# Patient Record
Sex: Female | Born: 1960 | Race: Black or African American | Hispanic: No | Marital: Single | Smoking: Former smoker
Health system: Southern US, Community
[De-identification: ages and names within clinical notes are randomized; demographics above are authoritative.]

## PROBLEM LIST (undated history)

## (undated) DIAGNOSIS — I10 Essential (primary) hypertension: Secondary | ICD-10-CM

## (undated) DIAGNOSIS — N12 Tubulo-interstitial nephritis, not specified as acute or chronic: Secondary | ICD-10-CM

## (undated) DIAGNOSIS — M542 Cervicalgia: Secondary | ICD-10-CM

## (undated) DIAGNOSIS — E119 Type 2 diabetes mellitus without complications: Secondary | ICD-10-CM

## (undated) HISTORY — DX: Tubulo-interstitial nephritis, not specified as acute or chronic: N12

## (undated) HISTORY — DX: Cervicalgia: M54.2

## (undated) HISTORY — DX: Essential (primary) hypertension: I10

## (undated) HISTORY — DX: Type 2 diabetes mellitus without complications: E11.9

---

## 1998-03-15 ENCOUNTER — Encounter: Admission: RE | Admit: 1998-03-15 | Discharge: 1998-03-15 | Payer: Self-pay | Admitting: Family Medicine

## 1998-12-01 ENCOUNTER — Encounter: Admission: RE | Admit: 1998-12-01 | Discharge: 1998-12-01 | Payer: Self-pay | Admitting: Family Medicine

## 1998-12-11 ENCOUNTER — Encounter: Admission: RE | Admit: 1998-12-11 | Discharge: 1998-12-11 | Payer: Self-pay | Admitting: Family Medicine

## 1999-01-09 ENCOUNTER — Encounter: Admission: RE | Admit: 1999-01-09 | Discharge: 1999-01-09 | Payer: Self-pay | Admitting: Family Medicine

## 1999-05-15 ENCOUNTER — Encounter: Admission: RE | Admit: 1999-05-15 | Discharge: 1999-05-15 | Payer: Self-pay | Admitting: Family Medicine

## 1999-06-05 ENCOUNTER — Encounter: Admission: RE | Admit: 1999-06-05 | Discharge: 1999-06-05 | Payer: Self-pay | Admitting: Sports Medicine

## 1999-10-18 ENCOUNTER — Encounter: Admission: RE | Admit: 1999-10-18 | Discharge: 1999-10-18 | Payer: Self-pay | Admitting: Family Medicine

## 1999-12-04 ENCOUNTER — Encounter: Admission: RE | Admit: 1999-12-04 | Discharge: 1999-12-04 | Payer: Self-pay | Admitting: Family Medicine

## 2000-09-09 ENCOUNTER — Encounter: Payer: Self-pay | Admitting: Emergency Medicine

## 2000-09-09 ENCOUNTER — Emergency Department (HOSPITAL_COMMUNITY): Admission: EM | Admit: 2000-09-09 | Discharge: 2000-09-09 | Payer: Self-pay | Admitting: Emergency Medicine

## 2000-11-19 ENCOUNTER — Encounter: Admission: RE | Admit: 2000-11-19 | Discharge: 2000-11-19 | Payer: Self-pay | Admitting: Family Medicine

## 2001-04-01 ENCOUNTER — Encounter: Admission: RE | Admit: 2001-04-01 | Discharge: 2001-04-01 | Payer: Self-pay | Admitting: Family Medicine

## 2001-07-18 ENCOUNTER — Encounter (INDEPENDENT_AMBULATORY_CARE_PROVIDER_SITE_OTHER): Payer: Self-pay | Admitting: *Deleted

## 2001-09-16 HISTORY — PX: ABDOMINAL HYSTERECTOMY: SUR658

## 2002-07-08 ENCOUNTER — Encounter: Admission: RE | Admit: 2002-07-08 | Discharge: 2002-07-08 | Payer: Self-pay | Admitting: Family Medicine

## 2002-11-05 ENCOUNTER — Encounter: Admission: RE | Admit: 2002-11-05 | Discharge: 2002-11-05 | Payer: Self-pay | Admitting: Family Medicine

## 2002-11-10 ENCOUNTER — Encounter: Admission: RE | Admit: 2002-11-10 | Discharge: 2002-11-10 | Payer: Self-pay | Admitting: Family Medicine

## 2002-11-10 ENCOUNTER — Encounter: Payer: Self-pay | Admitting: Family Medicine

## 2002-12-07 ENCOUNTER — Encounter: Admission: RE | Admit: 2002-12-07 | Discharge: 2002-12-07 | Payer: Self-pay | Admitting: Family Medicine

## 2003-07-21 ENCOUNTER — Inpatient Hospital Stay (HOSPITAL_COMMUNITY): Admission: RE | Admit: 2003-07-21 | Discharge: 2003-07-24 | Payer: Self-pay | Admitting: Obstetrics

## 2003-07-21 ENCOUNTER — Encounter (INDEPENDENT_AMBULATORY_CARE_PROVIDER_SITE_OTHER): Payer: Self-pay | Admitting: Specialist

## 2004-10-22 ENCOUNTER — Ambulatory Visit: Payer: Self-pay | Admitting: Family Medicine

## 2006-10-07 ENCOUNTER — Ambulatory Visit: Payer: Self-pay | Admitting: Family Medicine

## 2006-11-13 DIAGNOSIS — Z87891 Personal history of nicotine dependence: Secondary | ICD-10-CM | POA: Insufficient documentation

## 2006-11-13 DIAGNOSIS — D869 Sarcoidosis, unspecified: Secondary | ICD-10-CM | POA: Insufficient documentation

## 2006-11-14 ENCOUNTER — Encounter (INDEPENDENT_AMBULATORY_CARE_PROVIDER_SITE_OTHER): Payer: Self-pay | Admitting: *Deleted

## 2007-02-05 ENCOUNTER — Ambulatory Visit: Payer: Self-pay | Admitting: Family Medicine

## 2007-02-05 DIAGNOSIS — E0865 Diabetes mellitus due to underlying condition with hyperglycemia: Secondary | ICD-10-CM

## 2007-02-05 DIAGNOSIS — E1165 Type 2 diabetes mellitus with hyperglycemia: Secondary | ICD-10-CM | POA: Insufficient documentation

## 2007-02-05 DIAGNOSIS — E7800 Pure hypercholesterolemia, unspecified: Secondary | ICD-10-CM | POA: Insufficient documentation

## 2007-02-05 DIAGNOSIS — E089 Diabetes mellitus due to underlying condition without complications: Secondary | ICD-10-CM | POA: Insufficient documentation

## 2007-02-05 DIAGNOSIS — E1169 Type 2 diabetes mellitus with other specified complication: Secondary | ICD-10-CM | POA: Insufficient documentation

## 2007-02-05 LAB — CONVERTED CEMR LAB
ALT: 10 units/L (ref 0–35)
AST: 11 units/L (ref 0–37)
Albumin: 4.8 g/dL (ref 3.5–5.2)
BUN: 13 mg/dL (ref 6–23)
Calcium: 9.8 mg/dL (ref 8.4–10.5)
Chloride: 99 meq/L (ref 96–112)
Glucose, Bld: 382 mg/dL — ABNORMAL HIGH (ref 70–99)
LDL Cholesterol: 78 mg/dL (ref 0–99)
Potassium: 4.8 meq/L (ref 3.5–5.3)
Sodium: 138 meq/L (ref 135–145)
Total Bilirubin: 0.5 mg/dL (ref 0.3–1.2)
Total Protein: 7.9 g/dL (ref 6.0–8.3)
VLDL: 26 mg/dL (ref 0–40)

## 2007-02-19 ENCOUNTER — Ambulatory Visit: Payer: Self-pay | Admitting: Family Medicine

## 2007-03-16 ENCOUNTER — Encounter: Admission: RE | Admit: 2007-03-16 | Discharge: 2007-03-30 | Payer: Self-pay | Admitting: Family Medicine

## 2007-03-18 ENCOUNTER — Telehealth: Payer: Self-pay | Admitting: *Deleted

## 2007-04-30 ENCOUNTER — Encounter: Admission: RE | Admit: 2007-04-30 | Discharge: 2007-04-30 | Payer: Self-pay | Admitting: Family Medicine

## 2007-04-30 ENCOUNTER — Ambulatory Visit: Payer: Self-pay | Admitting: Family Medicine

## 2007-04-30 IMAGING — CR DG WRIST COMPLETE 3+V*R*
4 series · 4 of 4 positions shown · non-contrast
Comparison: none

CLINICAL DATA: Pain and swelling.
 RIGHT WRIST ? FOUR VIEWS:
 The radiocarpal joint space appears normal and the carpal bones are in normal position.  No bony abnormality is seen.  No soft tissue calcification is noted.

[view not recorded (1 of 4)]
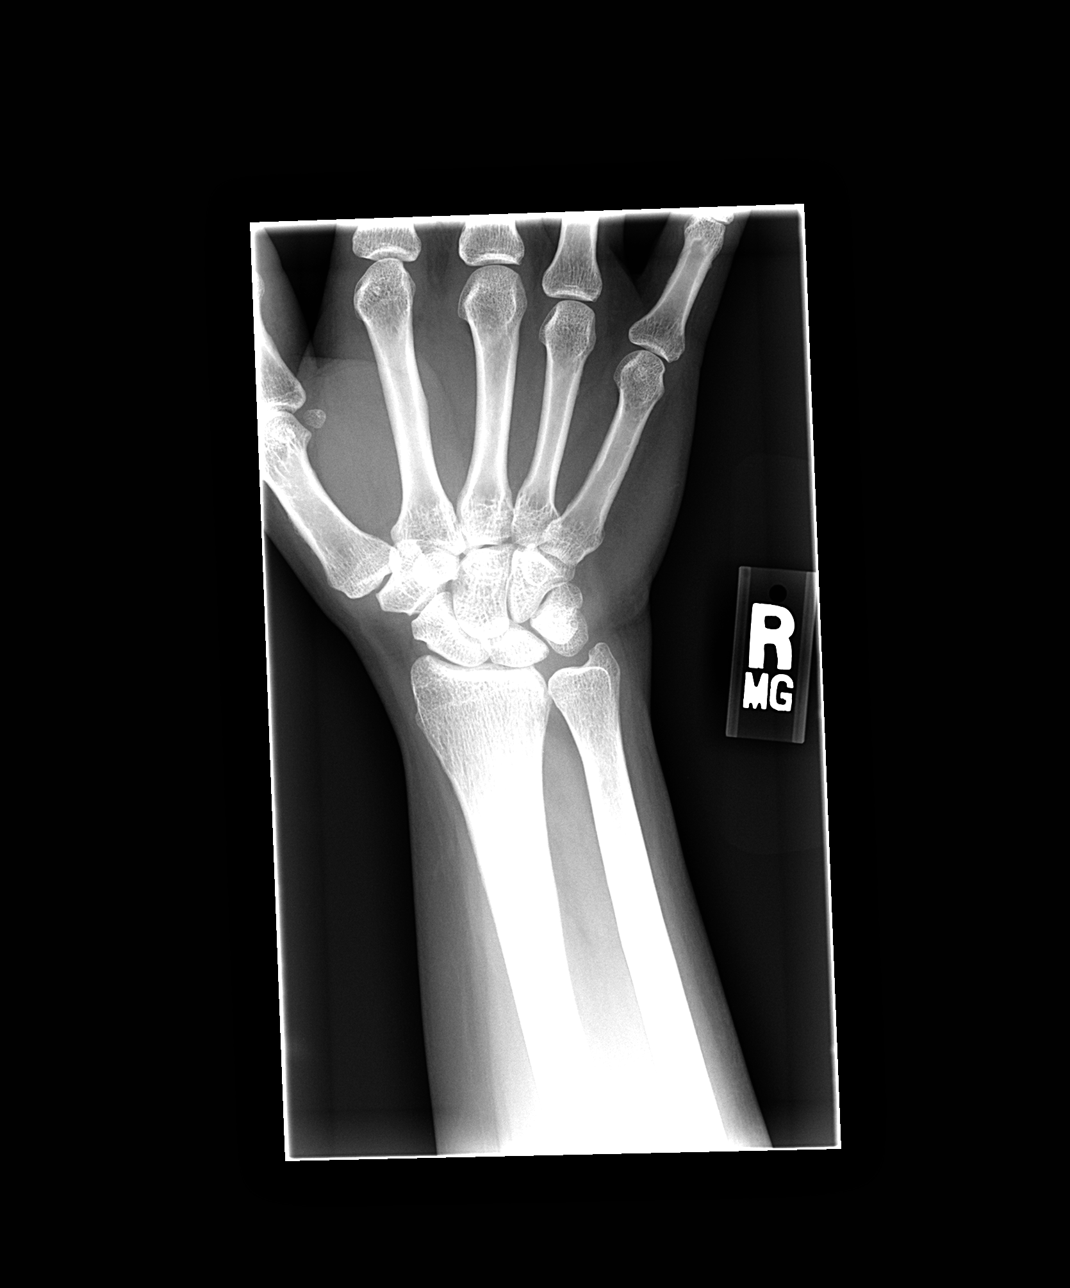

[view not recorded (2 of 4)]
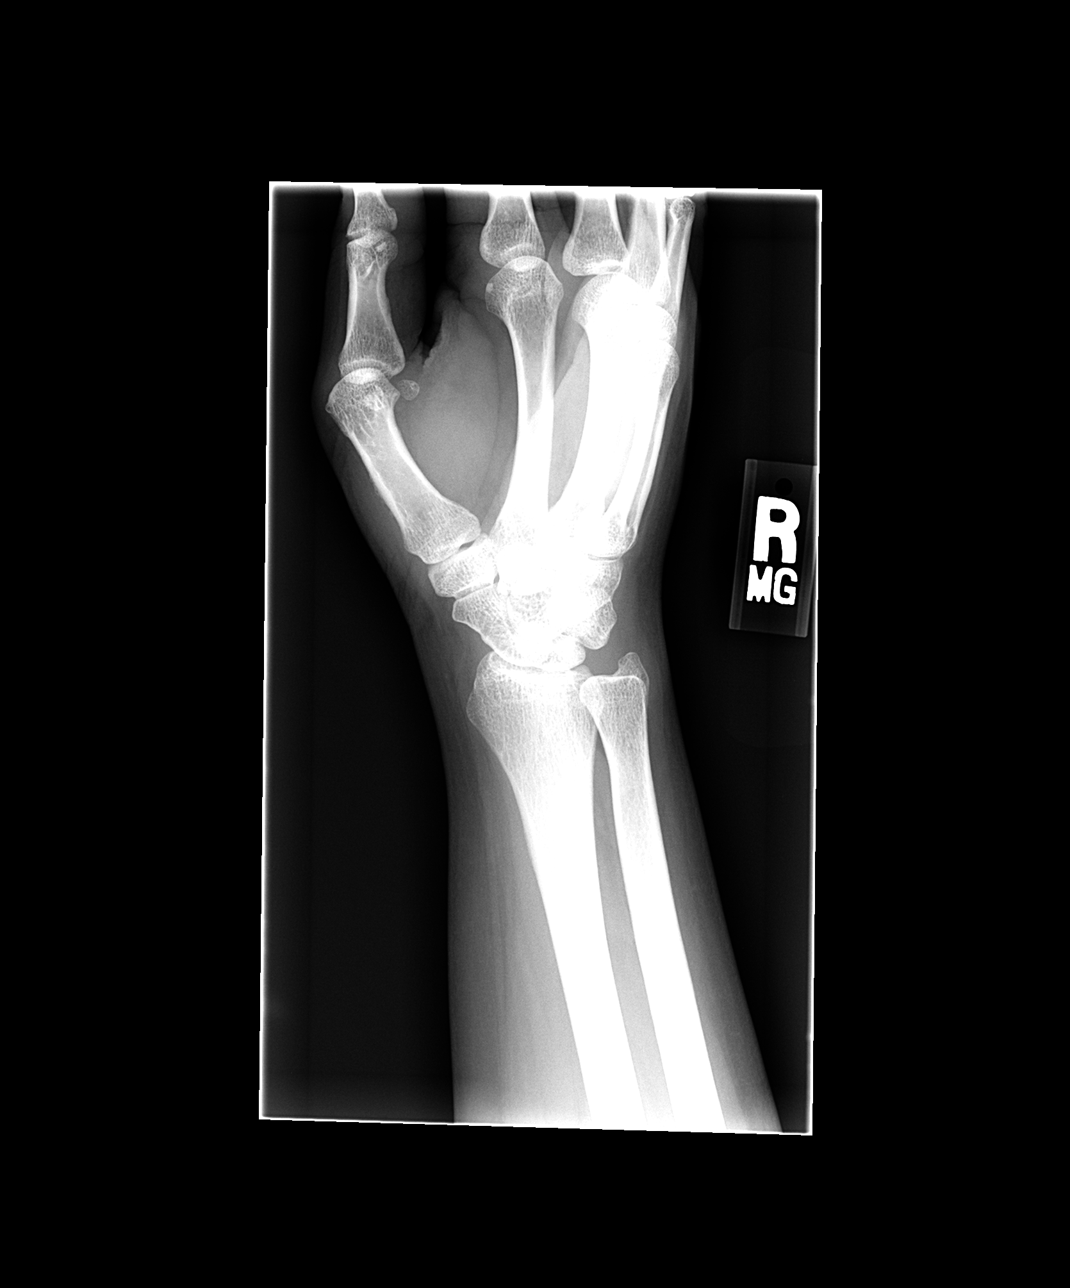

[view not recorded (3 of 4)]
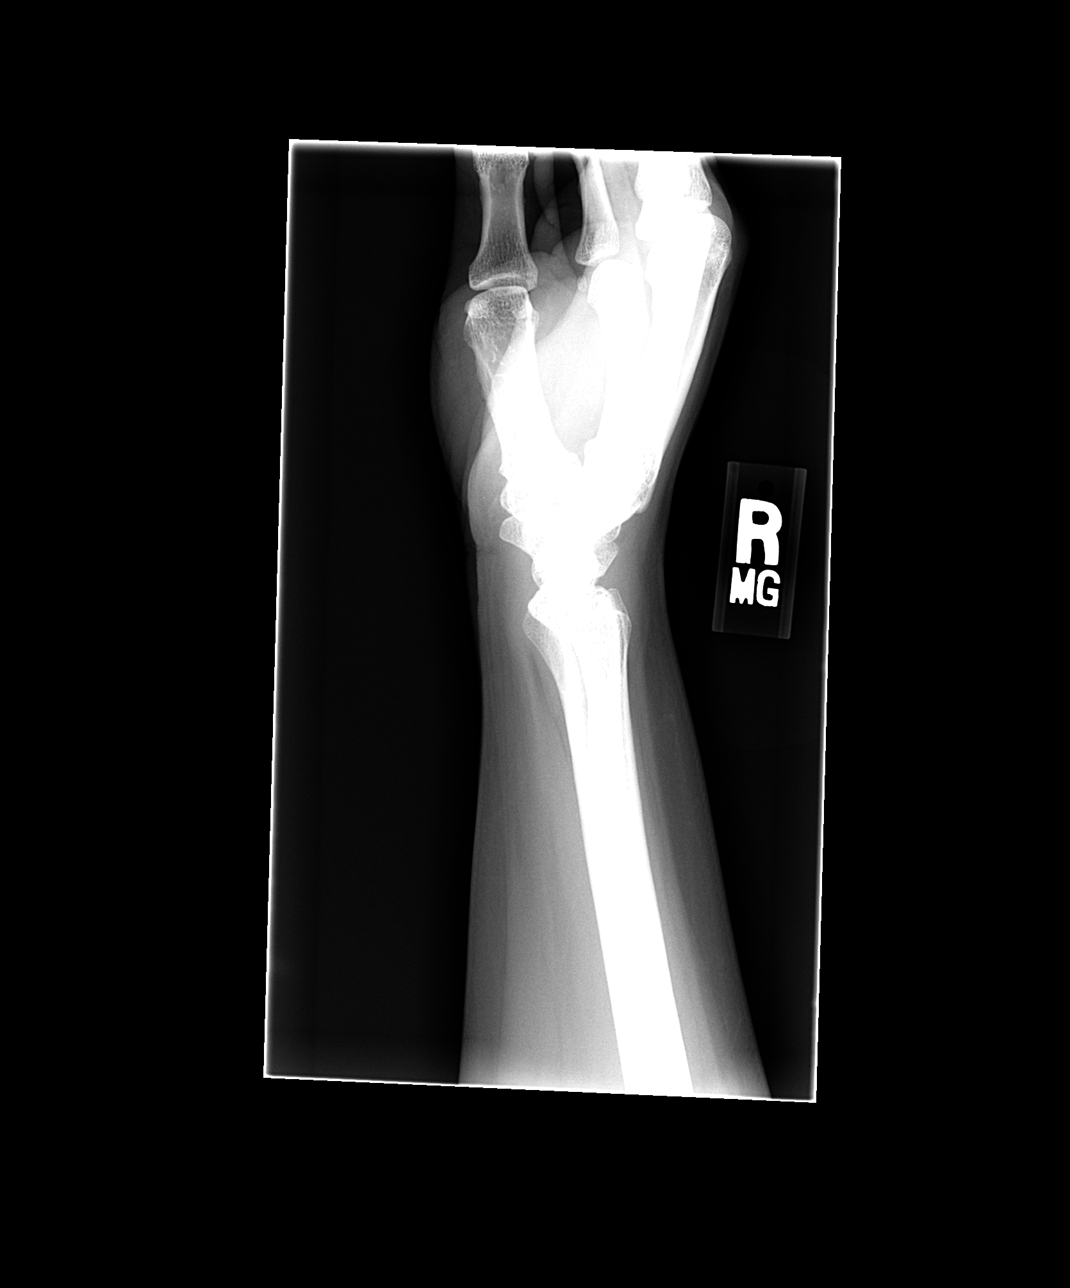

[view not recorded (4 of 4)]
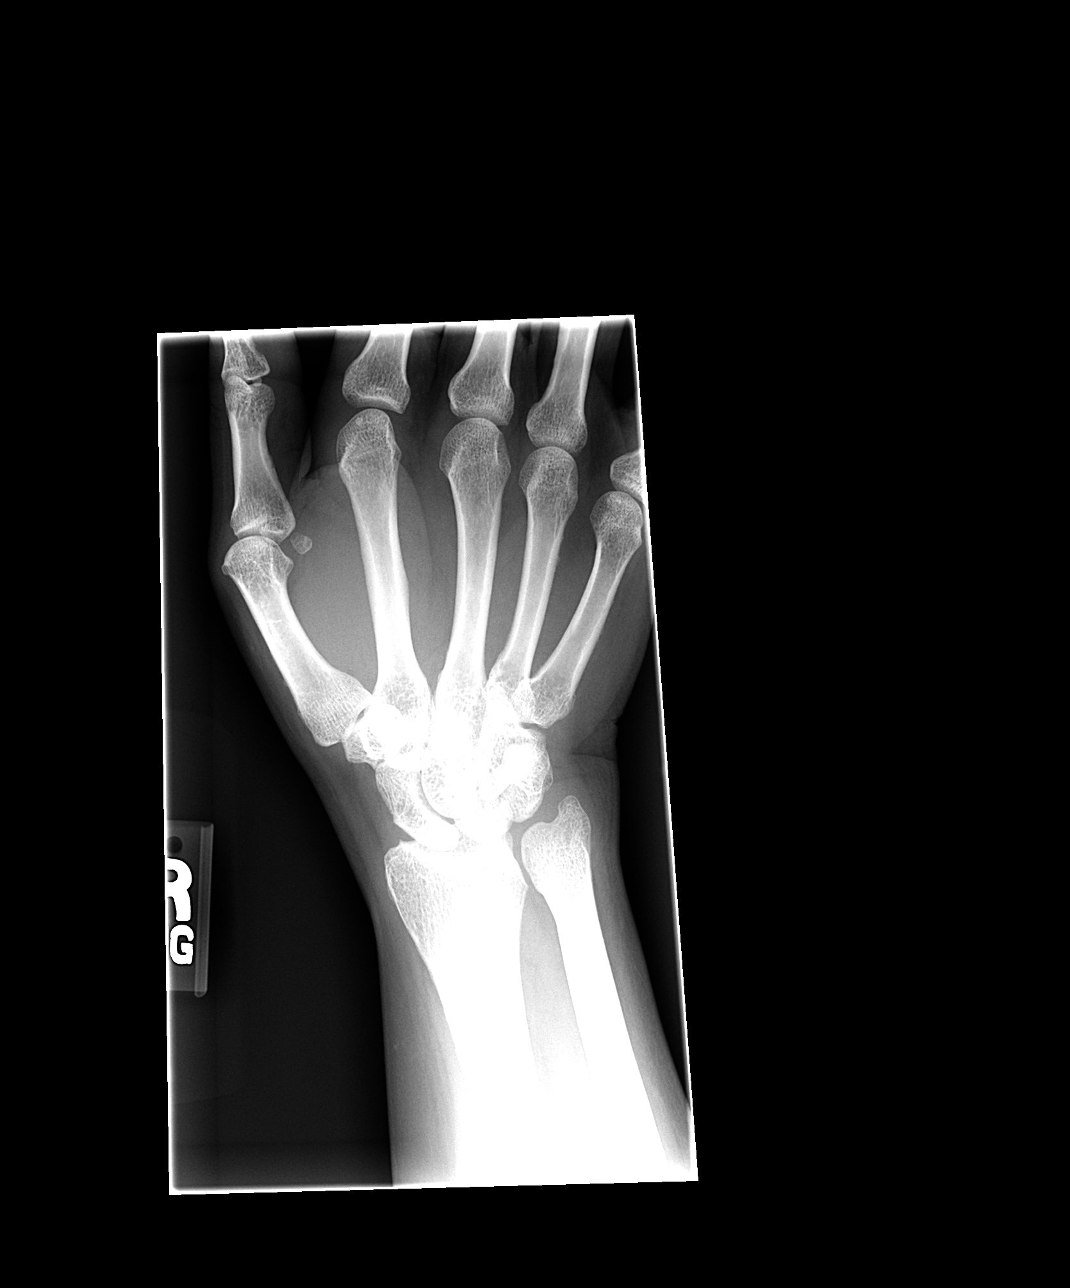

[4 of 4 positions shown; findings below may reference images not displayed]

IMPRESSION: Negative right wrist.

## 2007-05-04 ENCOUNTER — Encounter: Payer: Self-pay | Admitting: Family Medicine

## 2007-08-25 ENCOUNTER — Telehealth: Payer: Self-pay | Admitting: Family Medicine

## 2007-09-29 ENCOUNTER — Encounter: Payer: Self-pay | Admitting: *Deleted

## 2008-02-22 ENCOUNTER — Encounter: Payer: Self-pay | Admitting: Family Medicine

## 2008-02-22 ENCOUNTER — Ambulatory Visit: Payer: Self-pay | Admitting: Family Medicine

## 2008-02-22 DIAGNOSIS — I10 Essential (primary) hypertension: Secondary | ICD-10-CM | POA: Insufficient documentation

## 2008-02-22 LAB — CONVERTED CEMR LAB
Hgb A1c MFr Bld: 7 %
Hgb A1c MFr Bld: 7 %

## 2008-03-10 ENCOUNTER — Telehealth: Payer: Self-pay | Admitting: *Deleted

## 2008-03-10 ENCOUNTER — Ambulatory Visit: Payer: Self-pay | Admitting: Family Medicine

## 2008-03-10 LAB — CONVERTED CEMR LAB
LDL Cholesterol: 65 mg/dL (ref 0–99)
VLDL: 14 mg/dL (ref 0–40)

## 2008-03-11 ENCOUNTER — Encounter: Payer: Self-pay | Admitting: Family Medicine

## 2008-04-14 ENCOUNTER — Telehealth: Payer: Self-pay | Admitting: *Deleted

## 2008-05-09 ENCOUNTER — Telehealth: Payer: Self-pay | Admitting: *Deleted

## 2008-05-30 ENCOUNTER — Encounter: Payer: Self-pay | Admitting: Family Medicine

## 2008-06-02 ENCOUNTER — Ambulatory Visit: Payer: Self-pay | Admitting: Family Medicine

## 2008-06-02 LAB — CONVERTED CEMR LAB
CO2: 26 meq/L (ref 19–32)
Calcium: 9.4 mg/dL (ref 8.4–10.5)
Creatinine, Ser: 0.86 mg/dL (ref 0.40–1.20)
Hgb A1c MFr Bld: 7.8 %
Sodium: 140 meq/L (ref 135–145)

## 2008-06-06 ENCOUNTER — Encounter: Payer: Self-pay | Admitting: Family Medicine

## 2008-06-17 ENCOUNTER — Telehealth: Payer: Self-pay | Admitting: Family Medicine

## 2009-07-18 ENCOUNTER — Encounter: Payer: Self-pay | Admitting: Family Medicine

## 2009-08-09 ENCOUNTER — Ambulatory Visit: Payer: Self-pay | Admitting: Family Medicine

## 2009-08-09 LAB — CONVERTED CEMR LAB
BUN: 12 mg/dL (ref 6–23)
Cholesterol: 147 mg/dL (ref 0–200)
HDL: 49 mg/dL (ref >39)
Hgb A1c MFr Bld: 9.9 %
Potassium: 4.5 meq/L (ref 3.5–5.3)
Sodium: 138 meq/L (ref 135–145)
Triglycerides: 104 mg/dL (ref <150)
VLDL: 21 mg/dL (ref 0–40)

## 2009-09-21 ENCOUNTER — Telehealth: Payer: Self-pay | Admitting: Family Medicine

## 2009-10-06 ENCOUNTER — Telehealth: Payer: Self-pay | Admitting: Family Medicine

## 2009-11-23 ENCOUNTER — Encounter: Payer: Self-pay | Admitting: Family Medicine

## 2009-12-18 ENCOUNTER — Ambulatory Visit: Payer: Self-pay | Admitting: Family Medicine

## 2010-02-02 ENCOUNTER — Ambulatory Visit: Payer: Self-pay | Admitting: Family Medicine

## 2010-02-05 ENCOUNTER — Ambulatory Visit: Payer: Self-pay | Admitting: Family Medicine

## 2010-03-16 DIAGNOSIS — N12 Tubulo-interstitial nephritis, not specified as acute or chronic: Secondary | ICD-10-CM

## 2010-03-16 HISTORY — DX: Tubulo-interstitial nephritis, not specified as acute or chronic: N12

## 2010-04-10 ENCOUNTER — Ambulatory Visit: Payer: Self-pay | Admitting: Family Medicine

## 2010-04-10 ENCOUNTER — Encounter: Payer: Self-pay | Admitting: Family Medicine

## 2010-04-10 DIAGNOSIS — N10 Acute tubulo-interstitial nephritis: Secondary | ICD-10-CM | POA: Insufficient documentation

## 2010-04-10 LAB — CONVERTED CEMR LAB
Bilirubin Urine: NEGATIVE
Nitrite: POSITIVE
Protein, U semiquant: 30
Urobilinogen, UA: 0.2

## 2010-04-12 ENCOUNTER — Telehealth: Payer: Self-pay | Admitting: Family Medicine

## 2010-04-13 ENCOUNTER — Encounter: Payer: Self-pay | Admitting: Family Medicine

## 2010-04-14 ENCOUNTER — Inpatient Hospital Stay (HOSPITAL_COMMUNITY): Admission: EM | Admit: 2010-04-14 | Discharge: 2010-04-15 | Payer: Self-pay | Admitting: Family Medicine

## 2010-04-14 ENCOUNTER — Encounter: Payer: Self-pay | Admitting: Family Medicine

## 2010-04-14 ENCOUNTER — Encounter: Payer: Self-pay | Admitting: Emergency Medicine

## 2010-04-14 ENCOUNTER — Ambulatory Visit: Payer: Self-pay | Admitting: Family Medicine

## 2010-04-14 IMAGING — CR DG CHEST 2V
2 series · 2 of 2 positions shown · non-contrast
Comparison: None

CLINICAL DATA: Fever and flank pain.

CHEST - 2 VIEW

[w chest pa]
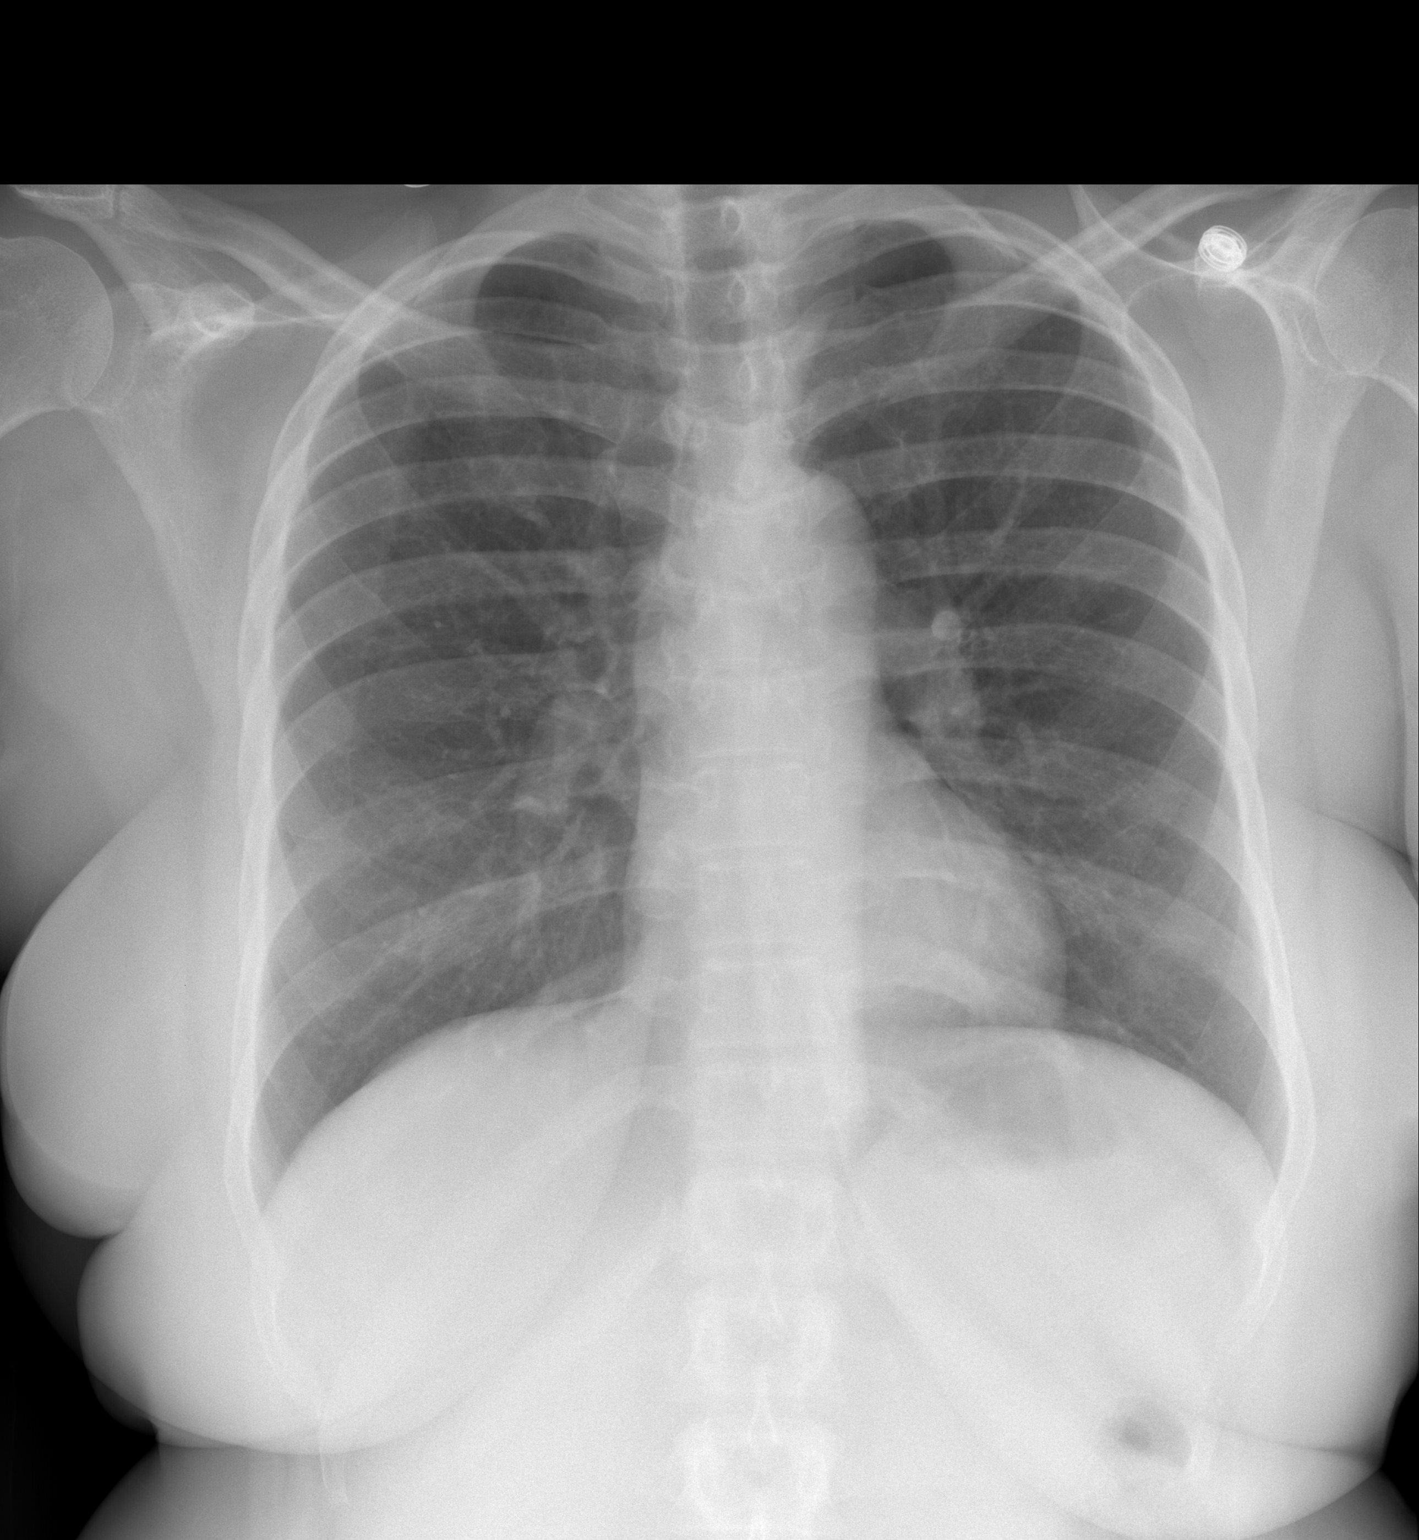

[w chest lat]
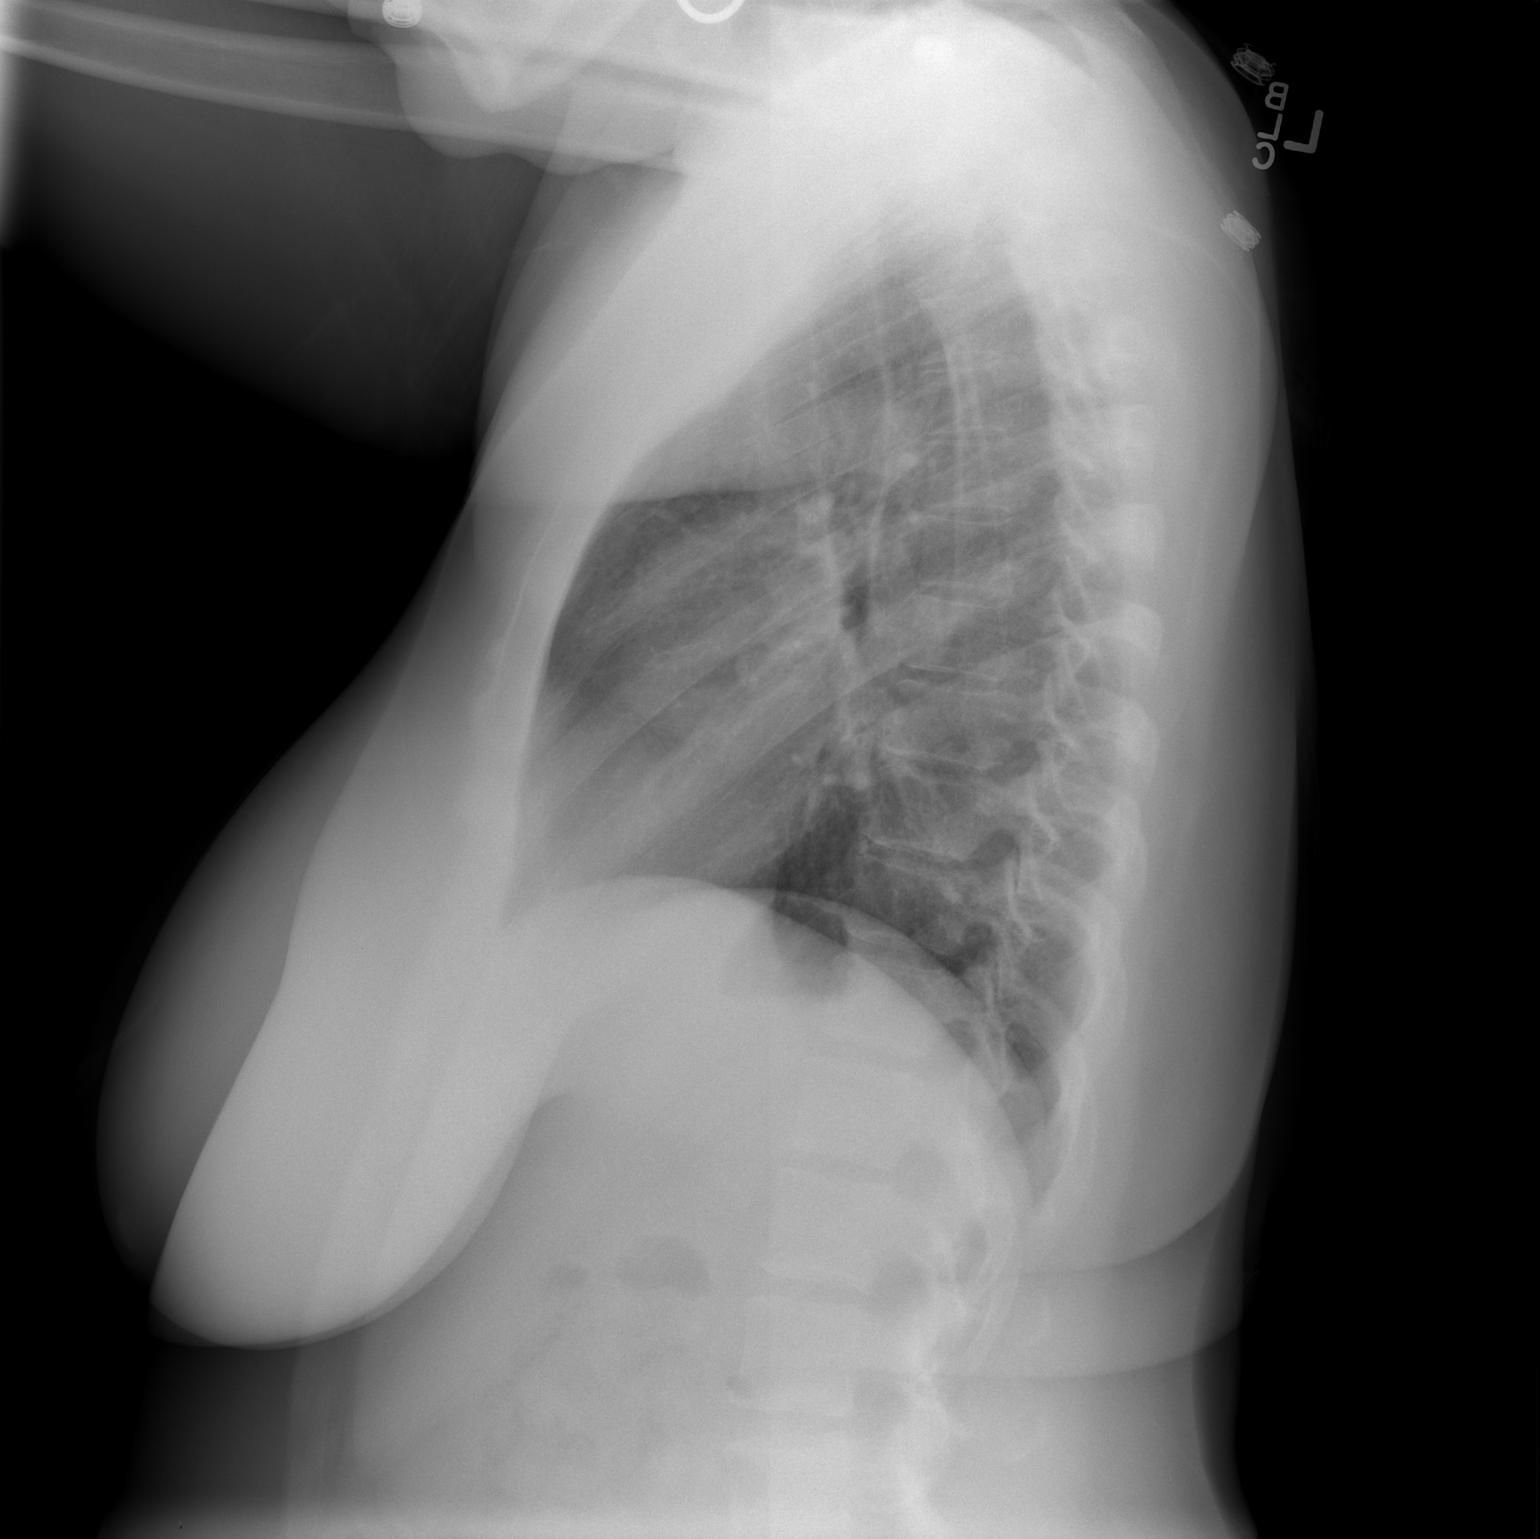

[2 of 2 positions shown; findings below may reference images not displayed]

FINDINGS: The cardiac silhouette, mediastinal and hilar contours
are within normal limits.  The lungs are clear.  The bony thorax is
intact.
IMPRESSION: No acute cardiopulmonary findings.

## 2010-04-30 ENCOUNTER — Ambulatory Visit: Payer: Self-pay | Admitting: Family Medicine

## 2010-04-30 LAB — CONVERTED CEMR LAB: Hgb A1c MFr Bld: 12 %

## 2010-06-08 ENCOUNTER — Telehealth: Payer: Self-pay | Admitting: Family Medicine

## 2010-10-05 ENCOUNTER — Ambulatory Visit: Admission: RE | Admit: 2010-10-05 | Discharge: 2010-10-05 | Payer: Self-pay | Source: Home / Self Care

## 2010-10-05 DIAGNOSIS — B029 Zoster without complications: Secondary | ICD-10-CM | POA: Insufficient documentation

## 2010-10-18 NOTE — Assessment & Plan Note (Signed)
Summary: F/U VISIT/[facility]   Vital Signs:  Patient profile:   50 year old female Height:      65 inches Weight:      193.1 pounds BMI:     32.25 Temp:     98.2 degrees F oral Pulse rate:   73 / minute BP sitting:   112 / 76  (left arm) Cuff size:   regular  Vitals Entered By: Garen Grams LPN (Feb 05, 2010 9:20 AM) CC: f/u blood sugar Is Patient Diabetic? Yes Pain Assessment Patient in pain? no        CC:  f/u blood sugar.  History of Present Illness: DIABETES Disease Monitoring Blood Sugar ranges:most > 200    Polyuria:N   Visual problems:N Medications Compliance:knows all her meds and doses and taking faithflly   Hypoglycemic symptoms:N Prevention Trying to watch carbs but weight has not decreased any.      Habits & Providers  Alcohol-Tobacco-Diet     Tobacco Status: quit  Current Medications (verified): 1)  Metformin Hcl 1000 Mg Tabs (Metformin Hcl) .... Take 1 Tablet in Am  11/2 in Pm 2)  Hydrochlorothiazide 25 Mg  Tabs (Hydrochlorothiazide) .... Take 1 Tab By Mouth Every Morning 3)  Hydroquinone 4 % Crea (Hydroquinone) .... Apply Two Times A Day To Lighten Skin Stop When Starts To Fade 1 Tube 4)  Accu-Chek Aviva  Strp (Glucose Blood) .... Once Daily 5)  Glipizide 10 Mg Tabs (Glipizide) .Marland Kitchen.. 1 Daily 6)  Amoxicillin 875 Mg Tabs (Amoxicillin) .... Two Times A Day For 7 Days 7)  Flonase 50 Mcg/act Susp (Fluticasone Propionate) .... Two Squirts in Each Nostril Daily  Allergies: 1)  Codeine Phosphate (Codeine Phosphate)   Impression & Recommendations:  Problem # 1:  DIABETES-TYPE 2 (ICD-250.00) Assessment Deteriorated  blood sugar are not well controlled.  She may be interested in [place] but would like to see if can lose weight first.  diabetes was controlled in 2009 when weighed 10 lbs less. Continue current medications  Her updated medication list for this problem includes:    Metformin Hcl 1000 Mg Tabs (Metformin hcl) .Marland Kitchen... Take 1 tablet in am  11/2 in  pm    Glipizide 10 Mg Tabs (Glipizide) .Marland Kitchen... 1 daily  Orders: [facility]- Est Level  3 ([address])  Problem # 2:  SINUSITIS, ACUTE (ICD-461.9) improving Her updated medication list for this problem includes:    Amoxicillin 875 Mg Tabs (Amoxicillin) .Marland Kitchen..Marland Kitchen Two times a day for 7 days    Flonase 50 Mcg/act Susp (Fluticasone propionate) .Marland Kitchen..Marland Kitchen Two squirts in each nostril daily  Complete Medication List: 1)  Metformin Hcl 1000 Mg Tabs (Metformin hcl) .... Take 1 tablet in am  11/2 in pm 2)  Hydrochlorothiazide 25 Mg Tabs (Hydrochlorothiazide) .... Take 1 tab by mouth every morning 3)  Hydroquinone 4 % Crea (Hydroquinone) .... Apply two times a day to lighten skin stop when starts to fade 1 tube 4)  Accu-chek Aviva Strp (Glucose blood) .... Once daily 5)  Glipizide 10 Mg Tabs (Glipizide) .Marland Kitchen.. 1 daily 6)  Amoxicillin 875 Mg Tabs (Amoxicillin) .... Two times a day for 7 days 7)  Flonase 50 Mcg/act Susp (Fluticasone propionate) .... Two squirts in each nostril daily  Patient Instructions: 1)  Come back in July for A1c 2)  Work on diet - consider diet history to review with your nutritionist 3)  Consider calorie counting 4)  Your aim is to lose 2 lb every week 5)  If your blood sugars  are always > 200 afer one month then call me   Prevention & Chronic Care Immunizations   Influenza vaccine: Not documented    Tetanus booster: Not documented    Pneumococcal vaccine: Not documented  Other Screening   Pap smear: Had jysterectomy in mid 2000s for benign disease  (02/23/2008)   Pap smear action/deferral: Not indicated S/P hysterectomy  (12/18/2009)   Pap smear due: 02/22/2009    Mammogram: Normal  (07/27/2009)   Mammogram due: 07/2010   Smoking status: quit  (02/05/2010)  Diabetes Mellitus   HgbA1C: 9.7  (12/18/2009)   Hemoglobin A1C due: 05/24/2008    Eye exam: Dr Dione Booze  (11/28/2009)    Foot exam: yes  (12/18/2009)   High risk foot: Not documented   Foot care education: Not documented    Foot exam due: 02/21/2009    Urine microalbumin/creatinine ratio: Not documented    Diabetes flowsheet reviewed?: Yes   Progress toward A1C goal: Unchanged  Lipids   Total Cholesterol: 147  (08/09/2009)   LDL: 77  (08/09/2009)   LDL Direct: Not documented   HDL: 49  (08/09/2009)   Triglycerides: 104  (08/09/2009)  Hypertension   Last Blood Pressure: 112 / 76  (02/05/2010)   Serum creatinine: 0.78  (08/09/2009)   Serum potassium 4.5  (08/09/2009)    Hypertension flowsheet reviewed?: Yes   Progress toward BP goal: At goal  Self-Management Support :   Personal Goals (by the next clinic visit) :     Personal A1C goal: 8  (12/18/2009)     Personal blood pressure goal: 140/90  (08/09/2009)     Personal LDL goal: 70  (08/09/2009)    Diabetes self-management support: Copy of home glucose meter record, CBG self-monitoring log, Written self-care plan  (12/18/2009)    Hypertension self-management support: BP self-monitoring log  (12/18/2009)    Hypertension self-management support not done because: Good outcomes  (08/09/2009)

## 2010-10-18 NOTE — Assessment & Plan Note (Signed)
Summary: ? sinus infection,tcb   Vital Signs:  Patient profile:   50 year old female Temp:     98.3 degrees F oral Pulse rate:   73 / minute BP sitting:   117 / 79  (left arm) Cuff size:   large  Vitals Entered By: Renato Battles slade,cma CC: sinus pressure, some congestion and head ache x 3 days. also green productive cough x 2 days. Is Patient Diabetic? Yes Pain Assessment Patient in pain? yes     Location: head Intensity: 5 Onset of pain  x 4 days.   CC:  sinus pressure and some congestion and head ache x 3 days. also green productive cough x 2 days.Marland Kitchen  History of Present Illness: Feels certain that she has a sinus infection, sick for over one week and getting sicker.  HA and a cough.  Denies fever.  Poorly controlled DM, plans to make apt with primary MD to discuss this.  Says that she watches her diet and gave examples of what she eats, did not sound too healthy.  Habits & Providers  Alcohol-Tobacco-Diet     Tobacco Status: quit  Current Medications (verified): 1)  Metformin Hcl 1000 Mg Tabs (Metformin Hcl) .... Take 1 Tablet in Am  11/2 in Pm 2)  Hydrochlorothiazide 25 Mg  Tabs (Hydrochlorothiazide) .... Take 1 Tab By Mouth Every Morning 3)  Hydroquinone 4 % Crea (Hydroquinone) .... Apply Two Times A Day To Lighten Skin Stop When Starts To Fade 1 Tube 4)  Accu-Chek Aviva  Strp (Glucose Blood) .... Once Daily 5)  Glipizide 10 Mg Tabs (Glipizide) .Marland Kitchen.. 1 Daily 6)  Amoxicillin 875 Mg Tabs (Amoxicillin) .... Two Times A Day For 7 Days 7)  Flonase 50 Mcg/act Susp (Fluticasone Propionate) .... Two Squirts in Each Nostril Daily  Allergies (verified): 1)  Codeine Phosphate (Codeine Phosphate)  Social History: Smoking Status:  quit  Review of Systems General:  Denies chills and fever. ENT:  Complains of earache, nasal congestion, postnasal drainage, sinus pressure, and sore throat. Resp:  Complains of cough and sputum productive; denies shortness of breath and  wheezing.  Physical Exam  General:  In no acute distress, had a sheet wrapped around her. Ears:  TM reddened and retracted Nose:  inflammed red turbs Mouth:  Post nasal drainage present Lungs:  normal respiratory effort and normal breath sounds.   Heart:  normal rate and regular rhythm.     Impression & Recommendations:  Problem # 1:  SINUSITIS, ACUTE (ICD-461.9)  Her updated medication list for this problem includes:    Amoxicillin 875 Mg Tabs (Amoxicillin) .Marland Kitchen..Marland Kitchen Two times a day for 7 days    Flonase 50 Mcg/act Susp (Fluticasone propionate) .Marland Kitchen..Marland Kitchen Two squirts in each nostril daily  Orders: [facility]- Est Level  3 (04540)  Complete Medication List: 1)  Metformin Hcl 1000 Mg Tabs (Metformin hcl) .... Take 1 tablet in am  11/2 in pm 2)  Hydrochlorothiazide 25 Mg Tabs (Hydrochlorothiazide) .... Take 1 tab by mouth every morning 3)  Hydroquinone 4 % Crea (Hydroquinone) .... Apply two times a day to lighten skin stop when starts to fade 1 tube 4)  Accu-chek Aviva Strp (Glucose blood) .... Once daily 5)  Glipizide 10 Mg Tabs (Glipizide) .Marland Kitchen.. 1 daily 6)  Amoxicillin 875 Mg Tabs (Amoxicillin) .... Two times a day for 7 days 7)  Flonase 50 Mcg/act Susp (Fluticasone propionate) .... Two squirts in each nostril daily  Patient Instructions: 1)  Lot of fluids 2)  Take all  of the antibiotic 3)  Use the nasal spray for 2-4 weeks 4)  Please schedule a follow-up appointment in the next month.  Prescriptions: FLONASE 50 MCG/ACT SUSP (FLUTICASONE PROPIONATE) two squirts in each nostril daily  #1 x 1   Entered and Authorized by:   Luretha Murphy NP   Signed by:   Luretha Murphy NP on 02/02/2010   Method used:   Electronically to        [facility] [phone]* (retail)       [address]       [place], [place]  [address]       Ph: [phone]       Fax: [phone]   RxID:   [id number] AMOXICILLIN 875 MG TABS (AMOXICILLIN) two times a day for 7 days  #14 x 0   Entered and Authorized by:    Luretha Murphy NP   Signed by:   Luretha Murphy NP on 02/02/2010   Method used:   Electronically to        [facility] [phone]* (retail)       [address]       [place], [place]  [address]       Ph: [phone]       Fax: [phone]   RxID:   [id number]

## 2010-10-18 NOTE — Progress Notes (Signed)
Summary: needs note  Phone Note Call from Patient Call back at Home Phone [phone]   Caller: Patient Summary of Call: was seen for kidney inf and has been out all week - needs a note to return to work on Monday fax to [phone] - attn: Jefm Petty Initial call taken by: De Nurse,  April 12, 2010 11:43 AM  Follow-up for Phone Call        Will defer to Dr Wallene Huh who saw her. Follow-up by: [name] MD,  April 12, 2010 12:19 PM  Additional Follow-up for Phone Call Additional follow up Details #1::        Please fax form to above number (in to be faxed pile)  Additional Follow-up by: Bobby Rumpf  MD,  April 13, 2010 9:34 AM    Additional Follow-up for Phone Call Additional follow up Details #2::    faxed - Friday - 7/29 Follow-up by: De Nurse,  April 16, 2010 9:58 AM

## 2010-10-18 NOTE — Assessment & Plan Note (Signed)
Summary: uti per pt/eo   Vital Signs:  Patient profile:   50 year old female Height:      65 inches Weight:      188 pounds BMI:     31.40 BSA:     1.93 Temp:     98.3 degrees F Pulse rate:   116 / minute BP sitting:   109 / 74  Vitals Entered By: Jone Baseman CMA (April 10, 2010 8:50 AM) CC: UTI x 5 days Is Patient Diabetic? No Pain Assessment Patient in pain? yes     Location: RLQ and back Intensity: 7   Primary Care Provider:  [name] MD  CC:  UTI x 5 days.  History of Present Illness: 1) UTI? : Dysuria x 5 days. Nausea, fever to 103 yestersay (relieved by aspirin). Mild suprapubic pain. moderate right back pain x 2 days. Myalgia x 1 day. Denies: history of structural or functional kidney disease, emesis, diarrhea, chest pain, dyspnea, abnormal vaginal bleeding, melena, hematochezia, constipation, hematuria, change in appetite, lethagy, prior pyelonephritis. Prior abdominal surgeries = fibroidectomy, partial hysterectomy.   Med rec as below. Allergies reviewed and as below.   Habits & Providers  Alcohol-Tobacco-Diet     Tobacco Status: never  Allergies: 1)  Codeine Phosphate (Codeine Phosphate)  Social History: Smoking Status:  never  Review of Systems       as per HPI o/w negative   Physical Exam  General:  In no acute distress, pleasant. Wrapped in sheet. Well appearing. Vitals reviewed.  Mouth:  moist membranes  Lungs:  normal respiratory effort and normal breath sounds.   Heart:  normal rate and regular rhythm.   Abdomen:  soft, mild diffuse suprapubic tender, +BS no guarding or rebound  Msk:  +right CVA tenderness  Pulses:  2+ radials  Extremities:  no edema  Neurologic:  alert & oriented X3.   Skin:  no rash or bruising   Impression & Recommendations:  Problem # 1:  PYELONEPHRITIS, ACUTE (ICD-590.10) Assessment New Urinalysis as below, clinical exam concerning for pyelonephritis. Will initiate outpatient at this time given  review of vitals, clinical condition. Levaquin as below. Will obtain Urine culture follow up on results. No imaging indicated at this time again given clinical condition and vitals.  Ibuprofen for pain / fever. follow up as needed of if not improving. Handout given. Reviewed red flags that would prompt immediate return to care.     Orders: [facility]- Est Level  3 (40981)  Complete Medication List: 1)  Metformin Hcl 1000 Mg Tabs (Metformin hcl) .... Take 1 tablet in am  11/2 in pm 2)  Hydrochlorothiazide 25 Mg Tabs (Hydrochlorothiazide) .... Take 1 tab by mouth every morning 3)  Accu-chek Aviva Strp (Glucose blood) .... Once daily 4)  Glipizide 10 Mg Tabs (Glipizide) .Marland Kitchen.. 1 daily 5)  Levaquin 750 Mg Tabs (Levofloxacin) .... One tab by mouth once a day x 7 days. 6)  Ibuprofen 800 Mg Tabs (Ibuprofen) .... One tab by mouth three times a day as needed for pain or fever  Other Orders: Urinalysis-[facility] (00000)  Patient Instructions: 1)  It was great to see you today!  2)  Take antibiotic as directed for your urinary tract infection. 3)  Take ibuprofen as directed for pain / fever. 4)  If you start to have worse symptoms inclidign worsening fever, nausea, vomiting, pain, come back in to be seen.  5)  Drink unsweetened cranberry juice for the next few days to help with your  symptoms.  6)  Follow up with your doctor at your next scheduled appointment.  Prescriptions: IBUPROFEN 800 MG TABS (IBUPROFEN) one tab by mouth three times a day as needed for pain or fever  #30 x 0   Entered and Authorized by:   Bobby Rumpf  MD   Signed by:   Bobby Rumpf  MD on 04/10/2010   Method used:   Electronically to        [facility] [phone]* (retail)       [address]       [place], [place]  [address]       Ph: [phone]       Fax: [phone]   RxID:   [id number] LEVAQUIN 750 MG TABS (LEVOFLOXACIN) one tab by mouth once a day x 7 days.  #7 x 0   Entered and Authorized by:   Bobby Rumpf  MD    Signed by:   Bobby Rumpf  MD on 04/10/2010   Method used:   Electronically to        [facility] [phone]* (retail)       [address]       [place], [place]  [address]       Ph: [phone]       Fax: [phone]   RxID:   [id number]   Laboratory Results   Urine Tests  Date/Time Received: April 10, 2010 8:54 AM  Date/Time Reported: April 10, 2010 9:23 AM April 10, 2010 9:29 AM   Routine Urinalysis   Color: yellow Appearance: Cloudy Glucose: 500   (Normal Range: Negative) Bilirubin: negative   (Normal Range: Negative) Ketone: small (15)   (Normal Range: Negative) Spec. Gravity: 1.015   (Normal Range: 1.003-1.035) Blood: large   (Normal Range: Negative) pH: 5.5   (Normal Range: 5.0-8.0) Protein: 30   (Normal Range: Negative) Urobilinogen: 0.2   (Normal Range: 0-1) Nitrite: positive   (Normal Range: Negative) Leukocyte Esterace: large   (Normal Range: Negative)  Urine Microscopic WBC/HPF: loaded RBC/HPF: TNTC Bacteria/HPF: 3+ Epithelial/HPF: 1-5    Comments: ...............test performed by......Marland KitchenBonnie A. [place], MLS (ASCP)cm      Appended Document: Orders Update    Clinical Lists Changes  Orders: Added new Test order of Urine Culture-[facility] ([id number]) - Signed

## 2010-10-18 NOTE — Progress Notes (Signed)
Summary: Rx Req  Phone Note Refill Request Call back at Work Phone [phone] Message from:  Patient  Refills Requested: Medication #1:  ACTOS 15 MG TABS 1 daily with other diabetes medications USUALLY TAKES ONE A DAY BUT NEEDS TO TAKE IT TWICE A DAY.  Initial call taken by: Clydell Hakim,  June 08, 2010 9:10 AM  Follow-up for Phone Call        spoke with patient and she states at last visit Dr Deirdre Priest advised her that if her Blood  Sugars   do not come down less than 150 to increase Actos to twice daily . she has recently incfeased to twice daily and BS are now doing well  , less than 150 and she need new RX sent to pharmacy with new directions.  pharmacy is [facility] , [address] Follow-up by: Theresia Lo RN,  June 08, 2010 10:26 AM  Additional Follow-up for Phone Call Additional follow up Details #1::        Sent Rx for ACTOS 30 mg tablet, one tablet a day. Dispense 30 tablets with one refill.  Further refills should come from Dr Deirdre Priest. Additional Follow-up by: Tawanna Cooler McDiarmid MD,  June 08, 2010 10:48 AM    New/Updated Medications: ACTOS 30 MG TABS (PIOGLITAZONE HCL) One tablet by mouth daily Prescriptions: ACTOS 30 MG TABS (PIOGLITAZONE HCL) One tablet by mouth daily  #30 x 1   Entered and Authorized by:   Tawanna Cooler McDiarmid MD   Signed by:   Tawanna Cooler McDiarmid MD on 06/08/2010   Method used:   Electronically to        [facility] [phone]* (retail)       [address]       [place], [place]  [address]       Ph: [phone]       Fax: [phone]   RxID:   [id number]  patient notified that rx was sent in. Theresia Lo RN  June 08, 2010 1:31 PM

## 2010-10-18 NOTE — Assessment & Plan Note (Signed)
Summary: back pain,df   Vital Signs:  Patient profile:   50 year old female Height:      65 inches Weight:      204 pounds BMI:     34.07 Temp:     98.3 degrees F oral Pulse rate:   77 / minute BP sitting:   110 / 74  (right arm) Cuff size:   large  Vitals Entered By: Tessie Fass CMA (October 05, 2010 10:29 AM) CC: back pain x 6 days Pain Assessment Patient in pain? yes     Location: back Intensity: 7   Primary Care Provider:  [name] MD  CC:  back pain x 6 days.  History of Present Illness: 6 days ago, traveling for work and in [facility] noted pain on the right side of her med back.  When she looked closer noted a bite like lesion.  The pain has persisted, it is getting worse, it kept her up last night.  SHe looked it up and thinks she has shingles.  Pain is constant not related to movement nothing makes it better one sided; does not cross the midline.  Almost out of diabetic meds and knows she needs to see prrmary MD.  Current Medications (verified): 1)  Metformin Hcl 1000 Mg Tabs (Metformin Hcl) .... Take 1 Tablet in Am  11/2 in Pm 2)  Hydrochlorothiazide 25 Mg  Tabs (Hydrochlorothiazide) .... Take 1 Tab By Mouth Every Morning 3)  Accu-Chek Aviva  Strp (Glucose Blood) .... Once Daily 4)  Glipizide 10 Mg Tabs (Glipizide) .Marland Kitchen.. 1 Daily 5)  Actos 30 Mg Tabs (Pioglitazone Hcl) .... One Tablet By Mouth Daily 6)  Hydrocodone-Acetaminophen 5-500 Mg Tabs (Hydrocodone-Acetaminophen) .... One Q 4-6 Hours As Needed For Pain 7)  Gabapentin 600 Mg Tabs (Gabapentin) .... 1/2 Tab At Bedtime, May Increase Up To One Tab Two Times A Day  Allergies (verified): 1)  Codeine Phosphate (Codeine Phosphate)  Physical Exam  General:  Well-developed,well-nourished,in no acute distress; alert,appropriate and cooperative throughout examination Skin:  one major raised red lesion with central vesicle that has dried, few small lesions in same vicinity, not other lesions in dermatoma  distribution.   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  Out of the window for antiviral therapy to be effective.  Orders: [facility]- Est Level  3 ([address])  Problem # 2:  DIABETES-TYPE 2 (ICD-250.00)  agreed to give one month supply and she was instructed to make apt in the next month with prmary MD and come in fasting. Her up udated medication list for this problem includes:    Metformin Hcl 1000 Mg Tabs (Metformin hcl) .Marland Kitchen... Take 1 tablet in am  11/2 in pm    Glipizide 10 Mg Tabs (Glipizide) .Marland Kitchen... 1 daily    Actos 30 Mg Tabs (Pioglitazone hcl) ..... One tablet by mouth daily  Orders: [facility]- Est Level  3 ([address])  Complete Medication List: 1)  Metformin Hcl 1000 Mg Tabs (Metformin hcl) .... Take 1 tablet in am  11/2 in pm 2)  Hydrochlorothiazide 25 Mg Tabs (Hydrochlorothiazide) .... Take 1 tab by mouth every morning 3)  Accu-chek Aviva Strp (Glucose blood) .... Once daily 4)  Glipizide 10 Mg Tabs (Glipizide) .Marland Kitchen.. 1 daily 5)  Actos 30 Mg Tabs (  Pioglitazone hcl) .... One tablet by mouth daily 6)  Hydrocodone-acetaminophen 5-500 Mg Tabs (Hydrocodone-acetaminophen) .... One q 4-6 hours as needed for pain 7)  Gabapentin 600 Mg Tabs (Gabapentin) .... 1/2 tab at bedtime, may increase up to one tab two times a day  Patient Instructions: 1)  Dr. Deirdre Priest in next month for diabetic and BP management, and follow up of shingles. 2)  Gabapentin will be for the pain the lingers, you may begin tonight as you are awakening because of the pain, titrate youself up to two times a day if this seems to be long term problem 3)  Hydrocodone is for acute pain 4)  I have given you a one month supply of your meds 5)  Consider using capsacin  cream topically this has been shown to help with the pain of shingles, must use qid Prescriptions: GLIPIZIDE 10 MG TABS (GLIPIZIDE) 1 daily  #30 x 0   Entered and Authorized by:   Luretha Murphy NP   Signed by:   Luretha Murphy NP on 10/05/2010   Method used:   Electronically to        [facility] [phone]* (retail)       [address]       [place], [place]  [address]       Ph: [phone]       Fax: [phone]   RxID:   [id number] HYDROCHLOROTHIAZIDE 25 MG  TABS (HYDROCHLOROTHIAZIDE) Take 1 tab by mouth every morning  #30 x 0   Entered and Authorized by:   Luretha Murphy NP   Signed by:   Luretha Murphy NP on 10/05/2010   Method used:   Electronically to        [facility] [phone]* (retail)       [address]       [place], [place]  [address]       Ph: [phone]       Fax: [phone]   RxID:   [id number] METFORMIN HCL 1000 MG TABS (METFORMIN HCL) Take 1 tablet in AM  11/2 in PM  #30 x 0   Entered and Authorized by:   Luretha Murphy NP   Signed by:   Luretha Murphy NP on 10/05/2010   Method used:   Electronically to        [facility] [phone]* (retail)       [address]       [place], [place]  [address]       Ph: [phone]       Fax: [phone]   RxID:   [id number] GABAPENTIN 600 MG TABS (GABAPENTIN) 1/2 tab at bedtime, may increase up to one tab two times a day Brand medically necessary #60 x 3   Entered and Authorized by:   Luretha Murphy NP   Signed by:   Luretha Murphy NP on 10/05/2010   Method used:   Print then Give to Patient   RxID:   [id number] HYDROCODONE-ACETAMINOPHEN 5-500 MG TABS (HYDROCODONE-ACETAMINOPHEN) one q 4-6 hours as needed for pain Brand medically necessary #28 x 0   Entered and Authorized by:   Luretha Murphy NP   Signed by:   Luretha Murphy NP on 10/05/2010   Method used:   Print then Give to Patient   RxID:   [id number]    Orders Added: 1)  [facility]- Est Level  3 [[address]85]

## 2010-10-18 NOTE — Progress Notes (Signed)
Summary: Rx Req  Phone Note Call from Patient Call back at [phone]   Caller: Patient Summary of Call: Would like to get a rx for Zovirax cream sent into [facility] on [address]. Initial call taken by: Clydell Hakim,  September 21, 2009 9:26 AM  Follow-up for Phone Call        to pcp Follow-up by: Golden Circle RN,  September 21, 2009 9:36 AM    New/Updated Medications: ZOVIRAX 5 % OINT (ACYCLOVIR) use on effected area every 3 hours Prescriptions: ZOVIRAX 5 % OINT (ACYCLOVIR) use on effected area every 3 hours  #1 x 1   Entered and Authorized by:   [name] MD   Signed by:   [name] MD on 09/21/2009   Method used:   Electronically to        [facility] [phone]* (retail)       [address]       [place], [place]  [address]       Ph: [phone]       Fax: [phone]   RxID:   [id number]   Appended Document: Rx Req Pt informed and agreeable to appt.  Will call back to schedule

## 2010-10-18 NOTE — Progress Notes (Signed)
Summary: Rx Req  Phone Note Refill Request Call back at [phone] Message from:  Patient  Refills Requested: Medication #1:  METFORMIN HCL 1000 MG TABS Take 1 tablet in AM  11/2 in PM  Medication #2:  HYDROCHLOROTHIAZIDE 25 MG  TABS Take 1 tab by mouth every morning PT USES [facility] [address]  Initial call taken by: Clydell Hakim,  October 06, 2009 2:22 PM  Follow-up for Phone Call        will forward to MD. Follow-up by: Theresia Lo RN,  October 06, 2009 2:28 PM    New/Updated Medications: METFORMIN HCL 1000 MG TABS (METFORMIN HCL) Take 1 tablet in AM  11/2 in PM HYDROCHLOROTHIAZIDE 25 MG  TABS (HYDROCHLOROTHIAZIDE) Take 1 tab by mouth every morning Prescriptions: HYDROCHLOROTHIAZIDE 25 MG  TABS (HYDROCHLOROTHIAZIDE) Take 1 tab by mouth every morning  #30 x 6   Entered and Authorized by:   [name] MD   Signed by:   [name] MD on 10/06/2009   Method used:   Electronically to        [facility] [phone]* (retail)       [address]       [place], [place]  [address]       Ph: [phone]       Fax: [phone]   RxID:   [id number] METFORMIN HCL 1000 MG TABS (METFORMIN HCL) Take 1 tablet in AM  11/2 in PM  #90 x 6   Entered and Authorized by:   [name] MD   Signed by:   [name] MD on 10/06/2009   Method used:   Electronically to        [facility] [phone]* (retail)       [address]       [place], [place]  [address]       Ph: [phone]       Fax: [phone]   RxID:   [id number]

## 2010-10-18 NOTE — Assessment & Plan Note (Signed)
Summary: f/up,tcb   Vital Signs:  Patient profile:   50 year old female Height:      65 inches Weight:      192 pounds BMI:     32.07 BSA:     1.95 Temp:     98.1 degrees F Pulse rate:   72 / minute BP sitting:   109 / 79  Vitals Entered By: Jone Baseman CMA (December 18, 2009 3:59 PM) CC: f/u Is Patient Diabetic? Yes Did you bring your meter with you today? No Pain Assessment Patient in pain? no        CC:  f/u.  History of Present Illness: HYPERTENSION Disease Monitoring Blood pressure range:not checkinb but has machine a t work   Chest pain:N   Dyspnea:N Medications Compliance:daily hctz   Lightheadedness:N   Edema:N   DIABETES Disease Monitoring Blood Sugar ranges:usually above 170   Polyuria:N   Visual problems:N Medications Compliance:daily   Hypoglycemic symptoms:N   PREVENTION Diet:eating alot of cereal   Exercise:Not walking    Habits & Providers  Alcohol-Tobacco-Diet     Tobacco Status: never  Current Medications (verified): 1)  Metformin Hcl 1000 Mg Tabs (Metformin Hcl) .... Take 1 Tablet in Am  11/2 in Pm 2)  Hydrochlorothiazide 25 Mg  Tabs (Hydrochlorothiazide) .... Take 1 Tab By Mouth Every Morning 3)  Hydroquinone 4 % Crea (Hydroquinone) .... Apply Two Times A Day To Lighten Skin Stop When Starts To Fade 1 Tube 4)  Accu-Chek Aviva  Strp (Glucose Blood) .... Once Daily 5)  Glipizide 10 Mg Tabs (Glipizide) .Marland Kitchen.. 1 Daily  Allergies: 1)  Codeine Phosphate (Codeine Phosphate)  Social History: Works at [employer]. Has dtr (1987) with SS who lives with her.  [place] [employer].   She likes to flower garden and to hand make Xmas cardsSmoking Status:  never  Physical Exam  General:  Well-developed,well-nourished,in no acute distress; alert,appropriate and cooperative throughout examination Lungs:  Normal respiratory effort, chest expands symmetrically. Lungs are clear to auscultation, no crackles or wheezes. Heart:  Normal rate and regular  rhythm. S1 and S2 normal without gallop, murmur, click, rub or other extra sounds.  Diabetes Management Exam:    Foot Exam (with socks and/or shoes not present):       Sensory-Pinprick/Light touch:          Left medial foot (L-4): normal          Left dorsal foot (L-5): normal          Left lateral foot (S-1): normal          Right medial foot (L-4): normal          Right dorsal foot (L-5): normal          Right lateral foot (S-1): normal       Sensory-Monofilament:          Left foot: normal          Right foot: normal       Inspection:          Left foot: normal          Right foot: normal       Nails:          Left foot: normal          Right foot: normal   Impression & Recommendations:  Problem # 1:  DIABETES-TYPE 2 (ICD-250.00) Assessment Deteriorated  Poor control.  Increase medications and work on diet exercsie  Her updated  medication list for this problem includes:    Metformin Hcl 1000 Mg Tabs (Metformin hcl) .Marland Kitchen... Take 1 tablet in am  11/2 in pm    Glipizide 10 Mg Tabs (Glipizide) .Marland Kitchen... 1 daily  Orders: [facility]1C-[facility] ([address]) [facility]- Est  Level 4 ([address])  Labs Reviewed: Creat: 0.78 (08/09/2009)     Last Eye Exam: Dr Dione Booze (11/28/2009) Reviewed HgBA1c results: 9.7 (12/18/2009)  9.9 (08/09/2009)  Problem # 2:  ESSENTIAL HYPERTENSION, BENIGN (ICD-401.1)  Good control  Her updated medication list for this problem includes:    Hydrochlorothiazide 25 Mg Tabs (Hydrochlorothiazide) .Marland Kitchen... 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 As directed  Complete Medication List: 1)  Metformin Hcl 1000 Mg Tabs (Metformin hcl) .... Take 1 tablet in am  11/2 in pm 2)  Hydrochlorothiazide 25 Mg Tabs (Hydrochlorothiazide) .... Take 1 tab by mouth every morning 3)  Hydroquinone 4 % Crea (Hydroquinone) .... Apply two times a day to lighten skin stop when starts to fade 1 tube 4)  Accu-chek Aviva Strp (Glucose blood) .... Once daily 5)  Glipizide 10 Mg Tabs (Glipizide) .Marland Kitchen.. 1 daily  Patient Instructions: 1)  Please schedule a follow-up appointment in 3 months .  2)  Check blood sugar each morning.  If it is above 160 regularly after 1 month of exercise and increased glipizide - call me 3)  Weigh yourself once a week aim for 2-3 lb of weight loss per week Prescriptions: GLIPIZIDE 10 MG TABS (GLIPIZIDE) 1 daily  #30 x 6   Entered and Authorized by:   [name] MD   Signed by:   [name] MD on 12/18/2009   Method used:   Electronically to        [facility] [phone]* (retail)       [address]       [place], [place]  [address]       Ph: [phone]       Fax: [phone]   RxID:   [id number]   Laboratory Results   Blood Tests   Date/Time Received: December 18, 2009 3:53 PM  Date/Time Reported: December 18, 2009 4:07 PM   HGBA1C: 9.7%   (Normal Range: Non-Diabetic - 3-6%   Control Diabetic - 6-8%)  Comments: ...........test performed by...........Marland KitchenTerese Door, CMA      Prevention & Chronic Care Immunizations   Influenza vaccine: Not documented    Tetanus booster: Not documented    Pneumococcal vaccine: Not documented  Other Screening   Pap smear: Had jysterectomy in mid 2000s for benign disease  (02/23/2008)   Pap smear action/deferral: Not indicated S/P hysterectomy  (12/18/2009)   Pap smear due: 02/22/2009    Mammogram: Normal  (07/27/2009)   Mammogram due: 07/2010   Smoking status: never  (12/18/2009)  Diabetes Mellitus   HgbA1C: 9.7  (12/18/2009)    Hemoglobin A1C due: 05/24/2008    Eye exam: Dr Dione Booze  (11/28/2009)    Foot exam: yes  (12/18/2009)   High risk foot:  Not documented   Foot care education: Not documented   Foot exam due: 02/21/2009    Urine microalbumin/creatinine ratio: Not documented    Diabetes flowsheet reviewed?: Yes   Progress toward A1C goal: Deteriorated  Lipids   Total Cholesterol: 147  (08/09/2009)   LDL: 77  (08/09/2009)   LDL Direct: Not documented   HDL: 49  (08/09/2009)   Triglycerides: 104  (08/09/2009)  Hypertension   Last Blood Pressure: 109 / 79  (12/18/2009)   Serum creatinine: 0.78  (08/09/2009)   Serum potassium 4.5  (08/09/2009)    Hypertension flowsheet reviewed?: Yes   Progress toward BP goal: At goal  Self-Management Support :   Personal Goals (by the next clinic visit) :     Personal A1C goal: 8  (12/18/2009)     Personal blood pressure goal: 140/90  (08/09/2009)     Personal LDL goal: 70  (08/09/2009)    Patient will work on the following items until the next clinic visit to reach self-care goals:     Medications and monitoring: weigh myself weekly  (12/18/2009)     Activity: take a 30 minute walk every day  (12/18/2009)     Other: Cut out cereal   (12/18/2009)    Diabetes self-management support: Copy of home glucose meter record, CBG self-monitoring log, Written self-care plan  (12/18/2009)   Diabetes care plan printed    Hypertension self-management support: BP self-monitoring log  (12/18/2009)    Hypertension self-management support not done because: Good outcomes  (08/09/2009)

## 2010-10-18 NOTE — Assessment & Plan Note (Signed)
Summary: Hospital Admission H&P   Vital Signs:  Patient profile:   50 year old female O2 Sat:      99 % on Room air Temp:     98.5 degrees F oral Pulse rate:   97 / minute Resp:     18 per minute BP supine:   103 / 71  Primary Provider:  [name] MD   History of Present Illness: This is a 50 year old female who presents with persistant R flank pain after being treated for R pyelonephritis as an outpatient. On 07/21, started having dysuria, right flank and abdominal pain. Symptoms worsened and on 07/25 also had fever at home (T 103) and chills. Saw Dr. Wallene Huh on 07/26 and was given Levofloxacin x7days. Pt. said she took medicine for 2 days but did not take medicine on 07/28 because medicine making her nauseous and her pain had improved. Then on 07/29, started feeling feverish (T 101) and right abdominal and flank pain returned. Now pain is persistant 7-8/10 pain alleviated by ibuprofen. Still complains of hematuria and mild dysuria, but denies dysuria, chills,  diarrhea.   Current Problems (verified): 1)  Pyelonephritis, Acute  (ICD-590.10) 2)  Sinusitis, Acute  (ICD-461.9) 3)  Essential Hypertension, Benign  (ICD-401.1) 4)  Diabetes-type 2  (ICD-250.00) 5)  Hx of Tobacco Dependence  (ICD-305.1) 6)  Sarcoidosis  (ICD-135) 7)  Asthma, Exercise Induced  (ICD-493.81)  Current Medications (verified): 1)  Metformin Hcl 1000 Mg Tabs (Metformin Hcl) .... Take 1 Tablet in Am  11/2 in Pm 2)  Hydrochlorothiazide 25 Mg  Tabs (Hydrochlorothiazide) .... Take 1 Tab By Mouth Every Morning 3)  Accu-Chek Aviva  Strp (Glucose Blood) .... Once Daily 4)  Glipizide 10 Mg Tabs (Glipizide) .Marland Kitchen.. 1 Daily 5)  Levaquin 750 Mg Tabs (Levofloxacin) .... One Tab By Mouth Once A Day X 7 Days. 6)  Ibuprofen 800 Mg Tabs (Ibuprofen) .... One Tab By Mouth Three Times A Day As Needed For Pain or Fever  Allergies (verified): 1)  Codeine Phosphate (Codeine Phosphate)  Past History:  Past Surgical  History: Last updated: 11/13/2006 fibroidectomy one oophrorectomy -, Hysterectomy - Partial 2004 - 10/07/2006, pelvic US 8/00 -  Family History: Last updated: 29-Apr-2010 Father: died from stroke at age 29's; h/o DM c blindness, amputations, and dialysis. Sister: poorly controlled diabetes. No h/o kidney problems or cancer.   Social History: Last updated: 04-29-10 Works at [employer]. Lives with daughter, 52 y.o. She likes to flower garden and to hand make Xmas cards.  Used to smoke 5 cig/day x67yrs. Quit 4 yrs ago.  Risk Factors: Smoking Status: never (04/10/2010) Packs/Day: 1/4 (02/19/2007)  Past Medical History: Sarcoidosis - occaisional eye problems alleviated by steroid eye drops; otherwise quiet. HTN (borderline) Asthma, exercise induced, resolved after  ~60lb wt loss.  Family History: Reviewed history from 02/05/2007 and no changes required. Father: died from stroke at age 21's; h/o DM c blindness, amputations, and dialysis. Sister: poorly controlled diabetes. No h/o kidney problems or cancer.   Social History: Reviewed history from 12/18/2009 and no changes required. Works at [employer]. Lives with daughter, 32 y.o. She likes to flower garden and to hand make Xmas cards.  Used to smoke 5 cig/day x21yrs. Quit 4 yrs ago.  Physical Exam  General:  Comfortable, sitting up in bed. Not feverish/diaphoretic. Mouth:  Pharynx moist.  Neck:  Supple. No thyromegaly or nodules. Lungs:  CTAB. Heart:  RRR, no m/r/g. Abdomen:  NABS, obese, some distension. Acutely tenderness RLQ with  some guarding. + CVA tenderness R flank.  Extremities:  1+ edema LE bilaterally. Sensation intact LE bilaterally. Additional Exam:  Ur preg: neg U/A abnormal for: Sp grav high 1.033, 15 ketones, lg blood, small leuks  CBC: WBC 9.0, Hgb 13.3, Hct 38.8, Plt 409 CMET: Na low 131, K 4.4, Cl 92, bicarb 21, BUN 31, Cr 1.91, Glc 472, TP high 8.4, alb low 3.3, T bili 1.4, AST 16, ALT 10, AP 106, Ca  9.8. Lipase 26  ABG: 7.425/32.3/42.8/20.8/2.4  CT abd/pelvis: abnormal areas of enhancement in R kidney c/w pyelonephritis. No hydronephrosis/nephrolithiasis.  CXR: no acute cardiopulmonary findings    Impression & Recommendations:  Problem # 1:  PYELONEPHRITIS, ACUTE (ICD-590.10) This is a 50 year old female who presents with persistant pyelonephritis. I would not consider it a treatment failure since she only took 2 days of the antibiotic and did not take the 3rd or 4th day. She is afebrile but has a return of the right abdominal and flank pain and continues to have hematuria and mild dysuria. We will D/C the Levaquin and treat her with Rocephin 1 gm once daily for better coverage of E. coli that her urine was positive for on 07/26. We will transition her to Keflex tomorrow if she does well overnight (no fevers, improvement of pain). We will also give her NS @ 250 cc/hr for now as she shows signs of dehydration (high spec grav on U/A and 3 lb wt loss since her OV on 07/26). We will give Tylenol 650mg  q6 hrs as needed pain. Zofran 4 mg IV as needed nausea.   Endocrine--Type 2 DM. Her sugars have been elevated the past few days since her infection. Her sugars at home are usually 150-250. We will D/C the metformin since she received contrast with her CT today but we will continue her glipizide. We will also put her on a moderate SSI regimen and check CBGs three times a day.   Renal--ARF. Cr Nov 2010 was 0.78. We will give her lots of fluids at this time and watch her Cr. At [facility], she received contrast with her CT-abdomen while still on metformin. Metformin has been D/C'd and she has been receiving fluids @ 500 cc/hr at [facility]  CV--h/o HTN. BP's fine. We will continue her home HCTZ 25mg .  FEN/GI--carbohydrate modified diet; IVF: NS @ 250cc/hr  PPx: Heparin 5000 units Subcutaneously three times a day  Dispo: likely D/C tomorrow on Keflex is afebrile and pain controlled/improved.     Complete Medication List: 1)  Metformin Hcl 1000 Mg Tabs (Metformin hcl) .... Take 1 tablet in am  11/2 in pm 2)  Hydrochlorothiazide 25 Mg Tabs (Hydrochlorothiazide) .... Take 1 tab by mouth every morning 3)  Accu-chek Aviva Strp (Glucose blood) .... Once daily 4)  Glipizide 10 Mg Tabs (Glipizide) .Marland Kitchen.. 1 daily 5)  Levaquin 750 Mg Tabs (Levofloxacin) .... One tab by mouth once a day x 7 days. 6)  Ibuprofen 800 Mg Tabs (Ibuprofen) .... One tab by mouth three times a day as needed for pain or fever

## 2010-10-18 NOTE — Letter (Signed)
Summary: Out of Work  [facility]  [address]   [place], [place] [address]   Phone: [phone]  Fax: [phone]    April 13, 2010   Employee:  Hevin Sthilaire    To Whom It May Concern:   For Medical reasons, please excuse the above named employee from work for the following dates:  Start:   04/09/10  Return to work: 04/16/10  If you need additional information, please feel free to contact our office.         Sincerely,    Bobby Rumpf  MD

## 2010-10-18 NOTE — Assessment & Plan Note (Signed)
Summary: Theresa Miles,Theresa Miles   Primary Care Anaka Beazer:  [name] MD   History of Present Illness: Pyelonephritis symptoms all resoleved and feels well except for a yeast infection. No fever no dysuria  DIABETES Disease Monitoring Blood Sugar ranges: all high   Polyuria:N   Visual problems:N  Medications Compliance:regularly using metformin and glipize knows doses   Hypoglycemic symptoms:N  Prevention Exercise:regularly  ROS - as above PMH - Medications reviewed and updated in medication list.  Smoking Status noted in VS form      Current Medications (verified): 1)  Metformin Hcl 1000 Mg Tabs (Metformin Hcl) .... Take 1 Tablet in Am  11/2 in Pm 2)  Hydrochlorothiazide 25 Mg  Tabs (Hydrochlorothiazide) .... Take 1 Tab By Mouth Every Morning 3)  Accu-Chek Aviva  Strp (Glucose Blood) .... Once Daily 4)  Glipizide 10 Mg Tabs (Glipizide) .Marland Kitchen.. 1 Daily 5)  Actos 15 Mg Tabs (Pioglitazone Hcl) .Marland Kitchen.. 1 Daily With Other Diabetes Medications 6)  Fluconazole 150 Mg Tabs (Fluconazole) .Marland Kitchen.. 1 Once  Allergies: 1)  Codeine Phosphate (Codeine Phosphate)  Physical Exam  General:  Well-developed,well-nourished,in no acute distress; alert,appropriate and cooperative throughout examination   Impression & Recommendations:  Problem # 1:  DIABETES-TYPE 2 (ICD-250.00)  worsened.  She is not interested in insulin.  Would like to try actos.  We discussed weight loss and diet in detail  Her updated medication list for this problem includes:    Metformin Hcl 1000 Mg Tabs (Metformin hcl) .Marland Kitchen... Take 1 tablet in am  11/2 in pm    Glipizide 10 Mg Tabs (Glipizide) .Marland Kitchen... 1 daily    Actos 15 Mg Tabs (Pioglitazone hcl) .Marland Kitchen... 1 daily with other diabetes medications  Orders: [facility]1C-[facility] ([address]) [facility]- Est  Level 4 ([address])  Labs Reviewed: Creat: 0.78 (08/09/2009)     Last Eye Exam: Dr Dione Booze (11/28/2009) Reviewed HgBA1c results: 12.0 (04/30/2010)  9.7 (12/18/2009)  Problem # 2:  ESSENTIAL HYPERTENSION,  BENIGN (ICD-401.1)  well controlled  Her updated medication list for this problem includes:    Hydrochlorothiazide 25 Mg Tabs (Hydrochlorothiazide) .Marland Kitchen... Take 1 tab by mouth every morning  Prior BP: 103/71 (04/14/2010)  Labs Reviewed: K+: 4.5 (08/09/2009) Creat: : 0.78 (08/09/2009)   Chol: 147 (08/09/2009)   HDL: 49 (08/09/2009)   LDL: 77 (08/09/2009)   TG: 104 (08/09/2009)  Orders: [facility]- Est  Level 4 ([address])  Problem # 3:  PYELONEPHRITIS, ACUTE (ICD-590.10)  resolved.    Orders: [facility]- Est  Level 4 ([address])  Problem # 4:  Hx of TOBACCO DEPENDENCE (ICD-305.1)  Complete Medication List: 1)  Metformin Hcl 1000 Mg Tabs (Metformin hcl) .... Take 1 tablet in am  11/2 in pm 2)  Hydrochlorothiazide 25 Mg Tabs (Hydrochlorothiazide) .... Take 1 tab by mouth every morning 3)  Accu-chek Aviva Strp (Glucose blood) .... Once daily 4)  Glipizide 10 Mg Tabs (Glipizide) .Marland Kitchen.. 1 daily 5)  Actos 15 Mg Tabs (Pioglitazone hcl) .Marland Kitchen.. 1 daily with other diabetes medications 6)  Fluconazole 150 Mg Tabs (Fluconazole) .Marland Kitchen.. 1 once  Other Orders: Future Orders: Comp [facility] ([id number]) ... 04/18/2011  Patient Instructions: 1)  Please schedule a follow-up appointment in 3 months .  2)  Take Actos 1 daily for one month.  If your blood sugar are not regularly < 150 then go up to two tablets a day. 3)  If you have any swelling of your legs or shortness of breath or abdominal pain call us 4)  You need a blood test in one month  call the day before and come in 5)  Keep a food diary and show to your nutritionist at work 6)  Goal is for weight to lose 15 lbs in next 3 months Prescriptions: FLUCONAZOLE 150 MG TABS (FLUCONAZOLE) 1 once  #1 x 1   Entered and Authorized by:   [name] MD   Signed by:   [name] MD on 04/30/2010   Method used:   Electronically to        [facility] [phone]* (retail)       [address]       [place], [place]  [address]       Ph: [phone]        Fax: [phone]   RxID:   [id number] ACTOS 15 MG TABS (PIOGLITAZONE HCL) 1 daily with other diabetes medications  #30 x 3   Entered and Authorized by:   [name] MD   Signed by:   [name] MD on 04/30/2010   Method used:   Electronically to        [facility] [phone]* (retail)       [address]       [place], [place]  [address]       Ph: [phone]       Fax: [phone]   RxID:   [id number]   Laboratory Results   Blood Tests   Date/Time Received: April 30, 2010 4:29 PM  Date/Time Reported: April 30, 2010 4:42 PM   HGBA1C: 12.0%   (Normal Range: Non-Diabetic - 3-6%   Control Diabetic - 6-8%)  Comments: ...........test performed by...........Marland KitchenTerese Door, CMA      Prevention & Chronic Care Immunizations   Influenza vaccine: Not documented    Tetanus booster: Not documented    Pneumococcal vaccine: Not documented  Other Screening   Pap smear: Had jysterectomy in mid 2000s for benign disease  (02/23/2008)   Pap smear action/deferral: Not indicated S/P hysterectomy  (12/18/2009)   Pap smear due: 02/22/2009    Mammogram: Normal  (07/27/2009)   Mammogram due: 07/2010   Smoking status: never  (04/10/2010)  Diabetes Mellitus   HgbA1C: 12.0  (04/30/2010)   Hemoglobin A1C due: 05/24/2008    Eye exam: Dr Dione Booze  (11/28/2009)    Foot exam: yes  (12/18/2009)   High risk foot: Not documented   Foot care education: Not documented   Foot exam due: 02/21/2009    Urine microalbumin/creatinine ratio: Not documented    Diabetes flowsheet reviewed?: Yes   Progress toward A1C goal: Deteriorated  Lipids   Total Cholesterol: 147  (08/09/2009)   LDL: 77  (08/09/2009)   LDL Direct: Not documented   HDL: 49  (08/09/2009)   Triglycerides: 104  (08/09/2009)  Hypertension   Last Blood Pressure: 109 / 74  (04/10/2010)   Serum creatinine: 0.78  (08/09/2009)   Serum potassium 4.5  (08/09/2009) CMP ordered     Hypertension  flowsheet reviewed?: Yes   Progress toward BP goal: At goal  Self-Management Support :   Personal Goals (by the next clinic visit) :     Personal A1C goal: 8  (12/18/2009)     Personal blood pressure goal: 140/90  (08/09/2009)     Personal LDL goal: 70  (08/09/2009)    Diabetes self-management support: Copy of home glucose meter record, CBG self-monitoring log, Written self-care plan  (12/18/2009)    Hypertension self-management support: BP self-monitoring log  (12/18/2009)    Hypertension self-management support not  done because: Good outcomes  (08/09/2009)

## 2010-10-19 NOTE — Consult Note (Signed)
Summary: [facility]   Imported By: Bradly Bienenstock 11/28/2009 14:33:00  _____________________________________________________________________  External Attachment:    Type:   Image     Comment:   External Document  Appended Document: Earley Brooke    Clinical Lists Changes  Observations: Added new observation of DIAB EYE EX: Dr Dione Booze (11/28/2009 14:40)       Allergies: 1)  Codeine Phosphate (Codeine Phosphate)   -  Date:  11/28/2009    Diabetes Eye Exam Dr Dione Booze

## 2010-11-19 ENCOUNTER — Telehealth: Payer: Self-pay | Admitting: Family Medicine

## 2010-11-19 NOTE — Telephone Encounter (Signed)
Was exposed to TB by her niece and and wants to know if she should come get tested.

## 2010-11-19 NOTE — Telephone Encounter (Signed)
States her niece & her family have tb. Thinks they got it from the father in law who just died. Not around them much but wants TB test done.. Denies symptoms. Has appt later in month & will wait until then. States eveyone in family is getting tb test done.Theresa Miles, Theresa Miles

## 2010-11-20 ENCOUNTER — Other Ambulatory Visit: Payer: Self-pay | Admitting: Family Medicine

## 2010-11-28 ENCOUNTER — Other Ambulatory Visit: Payer: Self-pay | Admitting: Family Medicine

## 2010-11-28 NOTE — Telephone Encounter (Signed)
Refill request

## 2010-12-01 LAB — COMPREHENSIVE METABOLIC PANEL
ALT: 10 U/L (ref 0–35)
AST: 16 U/L (ref 0–37)
Alkaline Phosphatase: 106 U/L (ref 39–117)
CO2: 21 mEq/L (ref 19–32)
Chloride: 92 mEq/L — ABNORMAL LOW (ref 96–112)
Creatinine, Ser: 1.91 mg/dL — ABNORMAL HIGH (ref 0.4–1.2)
GFR calc Af Amer: 34 mL/min — ABNORMAL LOW (ref >60)
GFR calc non Af Amer: 28 mL/min — ABNORMAL LOW (ref >60)
Potassium: 4.4 mEq/L (ref 3.5–5.1)
Sodium: 131 mEq/L — ABNORMAL LOW (ref 135–145)
Total Bilirubin: 1.4 mg/dL — ABNORMAL HIGH (ref 0.3–1.2)

## 2010-12-01 LAB — CBC
Hemoglobin: 10.9 g/dL — ABNORMAL LOW (ref 12.0–15.0)
Hemoglobin: 13.3 g/dL (ref 12.0–15.0)
MCH: 28.1 pg (ref 26.0–34.0)
MCH: 27.8 pg (ref 26.0–34.0)
RBC: 3.87 MIL/uL (ref 3.87–5.11)
RBC: 4.81 MIL/uL (ref 3.87–5.11)

## 2010-12-01 LAB — POCT PREGNANCY, URINE: Preg Test, Ur: NEGATIVE

## 2010-12-01 LAB — DIFFERENTIAL
Basophils Absolute: 0 10*3/uL (ref 0.0–0.1)
Basophils Relative: 0 % (ref 0–1)
Eosinophils Absolute: 0 10*3/uL (ref 0.0–0.7)
Eosinophils Relative: 0 % (ref 0–5)

## 2010-12-01 LAB — URINALYSIS, ROUTINE W REFLEX MICROSCOPIC
Bilirubin Urine: NEGATIVE
Ketones, ur: 15 mg/dL — AB
Nitrite: NEGATIVE
Urobilinogen, UA: 0.2 mg/dL (ref 0.0–1.0)

## 2010-12-01 LAB — GLUCOSE, CAPILLARY: Glucose-Capillary: 347 mg/dL — ABNORMAL HIGH (ref 70–99)

## 2010-12-01 LAB — BASIC METABOLIC PANEL
Calcium: 8.3 mg/dL — ABNORMAL LOW (ref 8.4–10.5)
Chloride: 106 mEq/L (ref 96–112)
Creatinine, Ser: 1.02 mg/dL (ref 0.4–1.2)
GFR calc Af Amer: >60 mL/min (ref >60)
Sodium: 138 mEq/L (ref 135–145)

## 2010-12-01 LAB — BLOOD GAS, VENOUS
Acid-base deficit: 2.4 mmol/L — ABNORMAL HIGH (ref 0.0–2.0)
O2 Saturation: 79 %
TCO2: 19 mmol/L (ref 0–100)
pO2, Ven: 42.8 mmHg (ref 30.0–45.0)

## 2010-12-01 LAB — URINE CULTURE: Colony Count: NO GROWTH

## 2010-12-01 LAB — LIPASE, BLOOD: Lipase: 26 U/L (ref 11–59)

## 2010-12-05 ENCOUNTER — Encounter: Payer: Self-pay | Admitting: Family Medicine

## 2010-12-05 ENCOUNTER — Ambulatory Visit (INDEPENDENT_AMBULATORY_CARE_PROVIDER_SITE_OTHER): Payer: 59 | Admitting: Family Medicine

## 2010-12-05 VITALS — BP 110/75 | Temp 98.4°F | Ht 65.6 in | Wt 203.0 lb

## 2010-12-05 DIAGNOSIS — E119 Type 2 diabetes mellitus without complications: Secondary | ICD-10-CM

## 2010-12-05 DIAGNOSIS — E669 Obesity, unspecified: Secondary | ICD-10-CM

## 2010-12-05 DIAGNOSIS — I10 Essential (primary) hypertension: Secondary | ICD-10-CM

## 2010-12-05 MED ORDER — METFORMIN HCL 1000 MG PO TABS: ORAL_TABLET | ORAL | Status: DC

## 2010-12-05 NOTE — Patient Instructions (Signed)
Will call with your A1c See your nutritionist at work See your eye doctor Come in fasting next visit to check your cholesterol Keep up the exercise

## 2010-12-05 NOTE — Assessment & Plan Note (Signed)
At [facility].  Continue current medications

## 2010-12-05 NOTE — Assessment & Plan Note (Signed)
Lab Results  Component Value Date   HGBA1C 7.8 12/05/2010   HGBA1C 12.0 04/30/2010   CREATININE 1.02 04/15/2010   CHOL 147 08/09/2009   HDL 49 08/09/2009   TRIG 104 08/09/2009    Assessment: Diabetes control: improved! Progress toward goals: improved  Plan: Diabetes treatment: continue current medications

## 2010-12-05 NOTE — Progress Notes (Signed)
  Subjective:    Patient ID: Theresa Miles, female    DOB: [birth year], 50 y.o.   MRN: [id number]  HPI HYPERTENSION Disease Monitoring Blood pressure range-usually controlled when she checks infrequently Chest pain- no      Dyspnea- no Medications Compliance- daily, knows all her meds Lightheadedness- no   Edema- no   DIABETES Disease Monitoring Blood Sugar ranges-usually below 200 Polyuria- no New Visual problems- no Medications Compliance- always Hypoglycemic symptoms- no  Obesity Exercising regularly. Trying to eat healthier but not using any portion control.  No skin lesions  ROS See HPI above   PMH Smoking Status noted     Review of Systems see above     Objective:   Physical Exam    Heart:  Normal rate and regular rhythm. S1 and S2 normal without gallop, murmur, click, rub or other extra sounds Lungs:  Normal respiratory effort, chest expands symmetrically. Lungs are clear to auscultation, no crackles or wheezes.     Assessment & Plan:

## 2010-12-05 NOTE — Assessment & Plan Note (Signed)
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   She really wants to lose weight

## 2010-12-06 ENCOUNTER — Telehealth: Payer: Self-pay | Admitting: Family Medicine

## 2010-12-06 NOTE — Telephone Encounter (Signed)
Left message on VM.

## 2010-12-19 LAB — GLUCOSE, CAPILLARY: Glucose-Capillary: 254 mg/dL — ABNORMAL HIGH (ref 70–99)

## 2010-12-20 ENCOUNTER — Other Ambulatory Visit: Payer: Self-pay | Admitting: Family Medicine

## 2010-12-21 NOTE — Telephone Encounter (Signed)
Refill request

## 2010-12-24 ENCOUNTER — Other Ambulatory Visit: Payer: Self-pay | Admitting: Family Medicine

## 2010-12-25 NOTE — Telephone Encounter (Signed)
Refill request

## 2010-12-26 NOTE — Telephone Encounter (Signed)
Refill request

## 2011-01-20 ENCOUNTER — Other Ambulatory Visit: Payer: Self-pay | Admitting: Family Medicine

## 2011-01-21 NOTE — Telephone Encounter (Signed)
Refill request

## 2011-01-22 ENCOUNTER — Other Ambulatory Visit: Payer: Self-pay | Admitting: Family Medicine

## 2011-01-22 NOTE — Telephone Encounter (Signed)
Refill request

## 2011-02-01 NOTE — Discharge Summary (Signed)
Theresa Miles, Theresa Miles                          ACCOUNT NO.:  [id number]   MEDICAL RECORD NO.:  [id number]                   PATIENT TYPE:  INP   LOCATION:  9310                                 FACILITY:  [facility]   PHYSICIAN:  Charles A. Clearance Coots, M.D.             DATE OF BIRTH:  Oct 01, 1960   DATE OF ADMISSION:  07/21/2003  DATE OF DISCHARGE:  07/24/2003                                 DISCHARGE SUMMARY   ADMISSION DIAGNOSIS:  Symptomatic uterine fibroids.   DISCHARGE DIAGNOSES:  Symptomatic uterine fibroids status post total  abdominal hysterectomy.  Discharged home in good condition.   REASON FOR ADMISSION:  A 50 year old black female G 2, P 0-0-2-0 history of  progressively painful and heavy periods unresponsive to medical therapy.  Had a history of a myomectomy approximately 10 years ago and over the past  12 months has had progressively more painful and heavy periods.  Follow up  exam and ultrasound revealed a regrowth of the fibroids and the patient  desired definitive surgical therapy.   PAST MEDICAL HISTORY:  Surgeries:  Myomectomy, left oophorectomy.  Illnesses:  Asthma.   MEDICATIONS:  Albuterol inhaler.   ALLERGIES:  No known drug allergies.   FAMILY HISTORY:  Positive for breast cancer, strokes, diabetes.   PHYSICAL EXAMINATION:  GENERAL:  A well-nourished, well-developed black  female in no acute distress.  HEENT:  Normal.  LUNGS:  Clear to auscultation bilaterally.  HEART:  Regular rate and rhythm.  ABDOMEN:  Soft, nontender.  No organomegaly appreciated.  PELVIC:  Normal external female genitalia.  Vaginal mucosa is normal.  Cervix without lesions or discharge.  Uterus 14-16 weeks size, irregular  contour, tender.  Adnexa negative for masses or tenderness.  EXTREMITIES:  Normal.  SKIN:  Normal.   HOSPITAL COURSE:  The patient underwent a total abdominal hysterectomy on  July 21, 2003.  There were no intraoperative complications.  Postoperative course was  uncomplicated and the patient was discharged home  on postoperative day #3 in good condition.   DISCHARGE DISPOSITION:  Medications:  Tylox one to two tablets every 3 to 4  hours as needed for pain.  Routine written instructions for hysterectomy  postoperative was given.  The patient is to call our office for a follow up  in two weeks.                                              Charles A. Clearance Coots, M.D.   CAH/MEDQ  D:  08/04/2003  T:  08/05/2003  Job:  [id number]

## 2011-02-01 NOTE — Op Note (Signed)
Theresa Miles, Theresa Miles                          ACCOUNT NO.:  [id number]   MEDICAL RECORD NO.:  [id number]                   PATIENT TYPE:  INP   LOCATION:  9399                                 FACILITY:  [facility]   PHYSICIAN:  Charles A. Clearance Coots, M.D.             DATE OF BIRTH:  February 26, 1961   DATE OF PROCEDURE:  07/21/2003  DATE OF DISCHARGE:                                 OPERATIVE REPORT   PREOPERATIVE DIAGNOSIS:  Symptomatic uterine fibroids.   POSTOPERATIVE DIAGNOSES:  1. Symptomatic uterine fibroids.  2. Multiple dense pelvic adhesions.   PROCEDURES:  1. Total abdominal hysterectomy.  2. Right salpingectomy.  3. Lysis of adhesions.   SURGEON:  Charles A. Clearance Coots, M.D.   ASSISTANT:  Roseanna Rainbow, M.D.   ANESTHESIA:  General.   ESTIMATED BLOOD LOSS:  150 mL.   FLUIDS REPLACED:  2000 mL.   URINE OUTPUT:  225 mL clear.   COMPLICATIONS:  None.   Foley to gravity.   FINDINGS:  Dense pelvic adhesions between the uterus on the left side and  the left pelvic sidewall.  No fallopian tube or ovary was observed on the  left side.  Uterine fibroids.   SPECIMENS:  Uterus with cervix, right fallopian tube.   OPERATION:  The patient was brought to the operating room and after  satisfactory general anesthesia, the abdomen was prepped and draped in the  usual sterile fashion.  A vertical skin incision was made through the  previous scar down to the fascia with a scalpel.  The fascia was nicked in  the midline and the fascial incision was extended superiorly and inferiorly  with curved Mayo scissors.  The rectus muscle was then divided in the  midline and the peritoneum was grasped with hemostats and was incised with  Metzenbaum scissors.  The peritoneum was then extended superiorly and  inferiorly with Metzenbaum scissors.  There were some adhesions between the  peritoneum and the omentum, which were lysed.  The O'Connor-O'Sullivan  retractor was then placed in the  incision and the bowel was packed off.  The  anterior-posterior blades were placed.  The uterus was identified and noted  to be tightly adhesed to the left pelvic sidewall.  The cornu of the uterus  was grasped with thyroid clamp and the right utero-ovarian ligament and  broad ligament and fallopian tube were crossclamped with Kelly forceps.  The  dense pelvic adhesions between the left pelvic sidewall and the left side of  the uterus were transected close to the left side of the uterus with sharp  dissection.  No fallopian tube or ovary could be identified on that side.  The adhesions were dropped away from the left side of the uterus with  careful dissection with Metzenbaum scissors until the uterine vessels could  be identified.  The round ligaments were then transected with the Bovie  cautery bilaterally and the anterior reflection of  the visceral peritoneum  was separated sharply with the Metzenbaum scissors away from the anterior  lower uterine segment and anterior cervix.  The bladder was then pushed down  and away from the operative field sharply and bluntly.  A window in the  broad ligament on the right side was created digitally and a Kelly forceps  was dropped down into the window of the broad ligament across the utero-  ovarian, broad, and fallopian tube.  A curved parametrial clamp was placed  beneath the Kelly forceps across the utero-ovarian and broad ligaments and  fallopian tube and these structures were then transected in between the two  clamps and a free tie of 0 Vicryl was placed beneath the parametrial clamp  and a second transfixion suture of 0 Vicryl was placed above the knot.  The  uterine vessels were then skeletonized and crossclamped with a curved  parametrial clamp and a straight parametrial clamp was placed above for  prevention of backflow.  The uterine vessels were then transected with  curved Mayo scissors and a transfixion suture of 0 Vicryl was placed  beneath  the parametrial clamp.  The same procedure was performed on the opposite  side without complications.  The cardinal ligaments were then serially  clamped, cut, and suture ligated with transfixion sutures of 0 Vicryl down  to the uterosacral ligaments bilaterally.  The uterosacral ligaments were  then clamped, cut, and suture ligated with transfixion sutures of 0 Vicryl  bilaterally, and these sutures were held with hemostats.  The vaginal cuff  was then crossclamped bilaterally with curved parametrial clamps, which met  in the center, and the cervix and uterus were then cut away from the vaginal  cuff and submitted to pathology for evaluation.  The corners of the vaginal  cuff were closed with transfixion sutures of 0 Vicryl and the center of the  vaginal cuff was closed with interrupted sutures of 0 Vicryl.  Hemostasis  was excellent.  The pedicles were observed for hemostasis, and there was no  active bleeding noted.  The pelvic cavity was thoroughly irrigated with warm  saline solution.  The pedicles and the vaginal cuff were again observed for  hemostasis and no active bleeding was noted.  All instruments were then  retired.  The self-retaining retractor was removed and all packing was  removed.  The abdomen was then closed as follows:  The fascia was closed  with a continuous suture of 0 PDS from each corner to the center.  The  subcutaneous tissue was closed with continuous suture of 2-0 plain catgut.  The subdermal layer was closed with a continuous suture of 3-0 Monocryl and  the skin was closed with a continuous subcuticular suture of 3-0 Monocryl.  Steri-Strips were applied to the incision closure for support.  A sterile  bandage was applied to the incision closure.  The surgical technician  indicated that all needle, sponge, and instrument counts were correct x2.  The patient tolerated the procedure well, was transported to the recovery room in satisfactory  condition.                                               Charles A. Clearance Coots, M.D.    CAH/MEDQ  D:  07/21/2003  T:  07/21/2003  Job:  [id number]

## 2011-12-05 ENCOUNTER — Other Ambulatory Visit: Payer: Self-pay | Admitting: Family Medicine

## 2011-12-11 ENCOUNTER — Ambulatory Visit (INDEPENDENT_AMBULATORY_CARE_PROVIDER_SITE_OTHER): Payer: Managed Care, Other (non HMO) | Admitting: Family Medicine

## 2011-12-11 ENCOUNTER — Encounter: Payer: Self-pay | Admitting: Family Medicine

## 2011-12-11 VITALS — BP 110/78 | HR 68 | Temp 98.2°F | Ht 65.6 in | Wt 193.0 lb

## 2011-12-11 DIAGNOSIS — E669 Obesity, unspecified: Secondary | ICD-10-CM

## 2011-12-11 DIAGNOSIS — E119 Type 2 diabetes mellitus without complications: Secondary | ICD-10-CM

## 2011-12-11 DIAGNOSIS — I10 Essential (primary) hypertension: Secondary | ICD-10-CM

## 2011-12-11 LAB — COMPREHENSIVE METABOLIC PANEL
ALT: 8 U/L (ref 0–35)
AST: 11 U/L (ref 0–37)
Albumin: 4.3 g/dL (ref 3.5–5.2)
Alkaline Phosphatase: 70 U/L (ref 39–117)
BUN: 17 mg/dL (ref 6–23)
Calcium: 9.6 mg/dL (ref 8.4–10.5)
Chloride: 103 mEq/L (ref 96–112)
Potassium: 3.9 mEq/L (ref 3.5–5.3)
Sodium: 139 mEq/L (ref 135–145)
Total Protein: 7 g/dL (ref 6.0–8.3)

## 2011-12-11 LAB — LIPID PANEL
HDL: 58 mg/dL (ref >39)
LDL Cholesterol: 91 mg/dL (ref 0–99)

## 2011-12-11 NOTE — Assessment & Plan Note (Signed)
Good control.  Check labs 

## 2011-12-11 NOTE — Patient Instructions (Addendum)
Consider a colonoscopy  See your eye doctor - ask them to send me a copy of your report  I will call you if your tests are not good.  Otherwise I will send you a letter.  If you do not hear from me with in 2 weeks please call our office.     Your A1c is 7.8  We should check this again in 3 months.  If still elevated may need to increase your medications

## 2011-12-11 NOTE — Assessment & Plan Note (Signed)
Fair control.. She will work more on exercise and weight loss. Check monitoring labs

## 2011-12-11 NOTE — Progress Notes (Signed)
  Subjective:    Patient ID: Theresa Miles, female    DOB: 1961/07/19, 51 y.o.   MRN: [id number]  HPI  HYPERTENSION Disease Monitoring: Blood pressure range-not checking Chest pain, palpitations- no      Dyspnea- no Medications: Compliance- daily hxtz Lightheadedness,Syncope- no   Edema- no   DIABETES Disease Monitoring: Blood Sugar ranges-almost all fasting < 150 Polyuria/phagia/dipsia- no      Visual problems- no Medications: Compliance- daily glipizide and metformin and actos Hypoglycemic symptoms- no  ROS See HPI above   PMH Smoking Status noted      Review of Systems     Objective:   Physical Exam  Heart - Regular rate and rhythm.  No murmurs, gallops or rubs.    Lungs:  Normal respiratory effort, chest expands symmetrically. Lungs are clear to auscultation, no crackles or wheezes. Diabetic Foot Check -  Appearance - no lesions, ulcers or calluses Skin - no unusual pallor or redness Sensation - grossly intact to light touch Monofilament exam normal        Assessment & Plan:

## 2011-12-11 NOTE — Assessment & Plan Note (Signed)
Stable.  Has lost weight since last visit but she wishes to get to 175

## 2011-12-12 ENCOUNTER — Encounter: Payer: Self-pay | Admitting: Family Medicine

## 2012-01-06 ENCOUNTER — Other Ambulatory Visit: Payer: Self-pay | Admitting: Family Medicine

## 2012-01-29 ENCOUNTER — Ambulatory Visit: Payer: Managed Care, Other (non HMO) | Admitting: Family Medicine

## 2012-01-29 ENCOUNTER — Encounter: Payer: Self-pay | Admitting: Family Medicine

## 2012-01-29 ENCOUNTER — Other Ambulatory Visit: Payer: Self-pay | Admitting: Family Medicine

## 2012-01-29 ENCOUNTER — Ambulatory Visit (INDEPENDENT_AMBULATORY_CARE_PROVIDER_SITE_OTHER): Payer: Managed Care, Other (non HMO) | Admitting: Family Medicine

## 2012-01-29 VITALS — BP 126/86 | HR 67 | Temp 98.6°F | Ht 65.6 in | Wt 194.0 lb

## 2012-01-29 DIAGNOSIS — R609 Edema, unspecified: Secondary | ICD-10-CM

## 2012-01-29 LAB — BASIC METABOLIC PANEL
CO2: 27 mEq/L (ref 19–32)
Chloride: 102 mEq/L (ref 96–112)
Glucose, Bld: 211 mg/dL — ABNORMAL HIGH (ref 70–99)
Potassium: 4.2 mEq/L (ref 3.5–5.3)
Sodium: 140 mEq/L (ref 135–145)

## 2012-01-29 LAB — CBC
HCT: 34.5 % — ABNORMAL LOW (ref 36.0–46.0)
Hemoglobin: 11.7 g/dL — ABNORMAL LOW (ref 12.0–15.0)
MCH: 27.3 pg (ref 26.0–34.0)
MCHC: 33.9 g/dL (ref 30.0–36.0)
MCV: 80.4 fL (ref 78.0–100.0)
RDW: 14.5 % (ref 11.5–15.5)

## 2012-01-29 NOTE — Assessment & Plan Note (Signed)
New Unsure of cause perhaps recent steroid injection.  Does have history of sarcoid and has diabetes but no signs of coronary artery disease or obstruction.  Had normal labs about 1 month ago.  Check cbc and bmet.   Will need more work up if does not resolve in a few weeks

## 2012-01-29 NOTE — Patient Instructions (Signed)
I will call you if your tests are not good.  Otherwise I will send you a letter.  If you do not hear from me with in 2 weeks please call our office.     Elevate legs as much as possible  Cut back on salt as much as possible  If the tests are normal and the edema is not a lot better in 3 weeks then come back  If you begin to have worse swelling or shortness of breath especially at night then call us immediately  Stool cards - send them in

## 2012-01-29 NOTE — Progress Notes (Signed)
  Subjective:    Patient ID: Theresa Miles, female    DOB: 13-Apr-1961, 51 y.o.   MRN: [id number]  HPI  Edema For the last week or so noticed her feet are swollen and achy at the end of the day.  Better in the AM.  No change in diet or medications or otc medications.  Did have steroid shoulder shot about 10 days ago.  No shortness of breath or chest pain or bleeding or lightheadness  PMH - no prior history of edema.  Does have sarcoid.   No history of anemia  Review of Symptoms - see HPI  PMH - Smoking status noted.      Review of Systems     Objective:   Physical Exam  Heart - Regular rate and rhythm.  No murmurs, gallops or rubs.    Lungs:  Normal respiratory effort, chest expands symmetrically. Lungs are clear to auscultation, no crackles or wheezes. Extremities:  No cyanosis,  1+ edema bilaterally at ankles, or deformity noted with good range of motion of all major joints.  No varicose veins         Assessment & Plan:

## 2012-01-30 ENCOUNTER — Encounter: Payer: Self-pay | Admitting: Family Medicine

## 2012-02-06 ENCOUNTER — Other Ambulatory Visit: Payer: Self-pay | Admitting: Family Medicine

## 2012-03-27 IMAGING — CT CT ABD-PELV W/ CM
2 of 5 series · 17 of 46 positions shown, 19 images · IV contrast (APPLIED)
Comparison: None

CLINICAL DATA: Abdominal pain, flank pain.

CT ABDOMEN AND PELVIS WITH CONTRAST
TECHNIQUE: Multidetector CT imaging of the abdomen and pelvis was
performed following the standard protocol during bolus
administration of intravenous contrast.
Contrast: 125 ml Omnipaque 300 IV.

[Series 2: abd_pel 5.0 b40f st · axial · 0.74mm/px · z∈[-382,-22]mm · 14 of 82 slices shown, 16 images]
[im 5/82  soft-tissue]
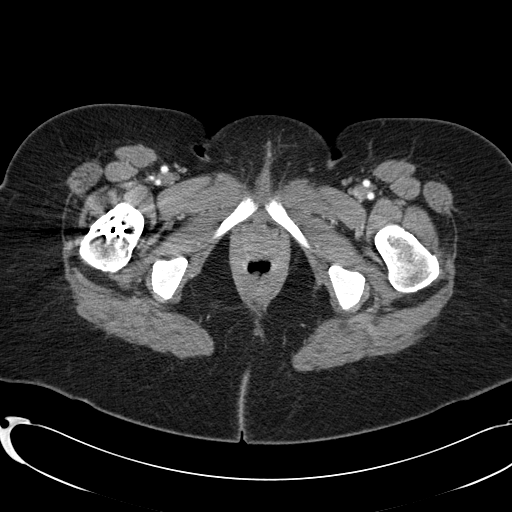
[im 5/82  bone]
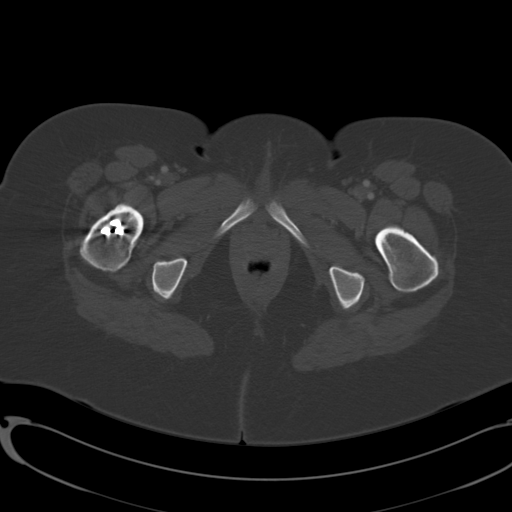
[im 10/82  soft-tissue]
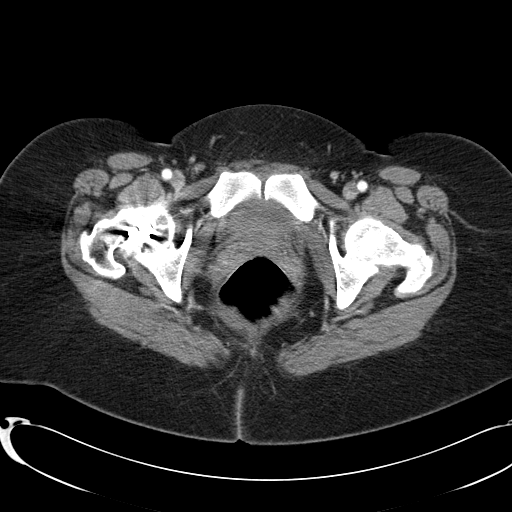
[im 15/82  soft-tissue]
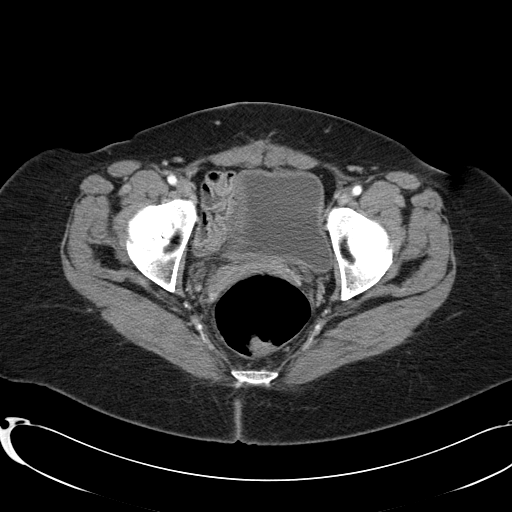
[im 24/82  soft-tissue]
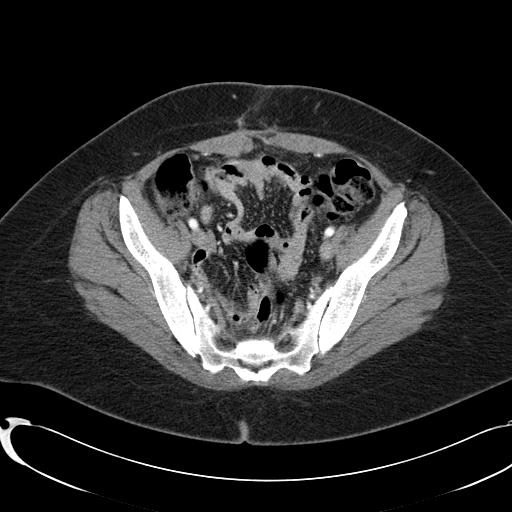
[im 29/82  soft-tissue]
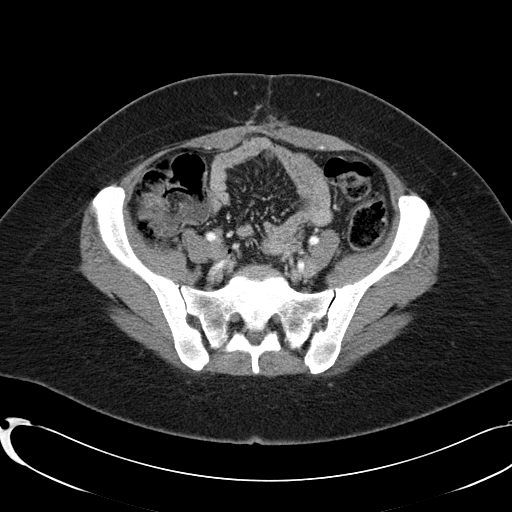
[im 34/82  soft-tissue]
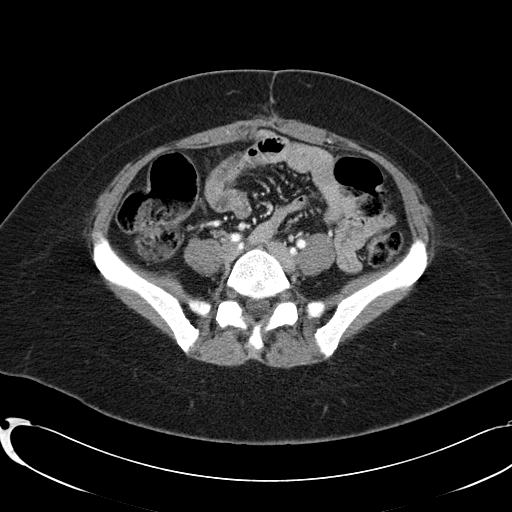
[im 39/82  soft-tissue]
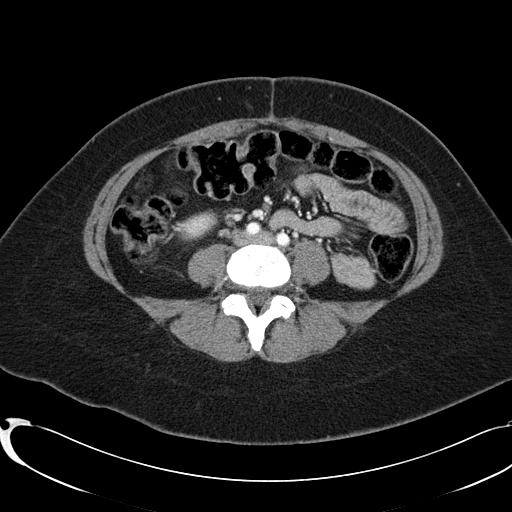
[im 43/82  soft-tissue]
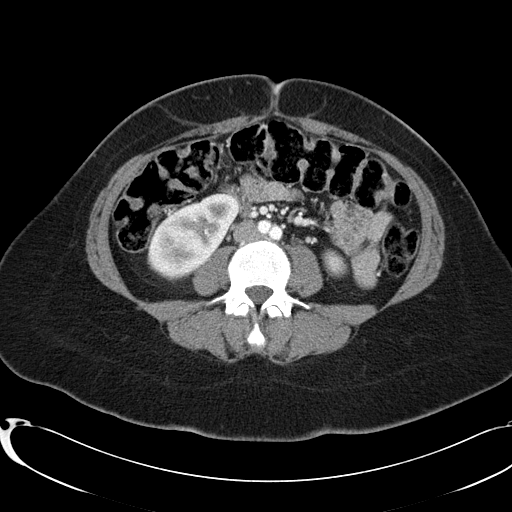
[im 48/82  soft-tissue]
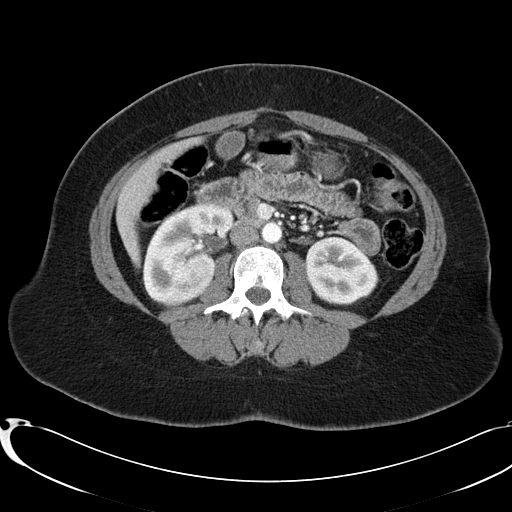
[im 48/82  bone]
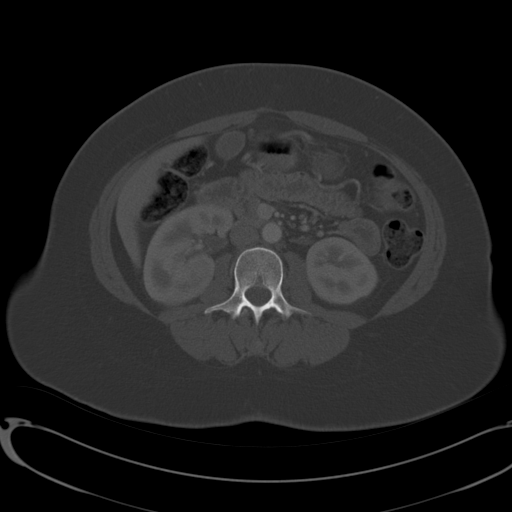
[im 53/82  soft-tissue]
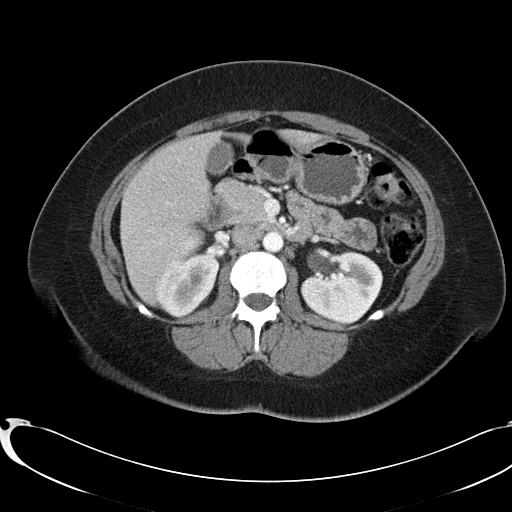
[im 62/82  soft-tissue]
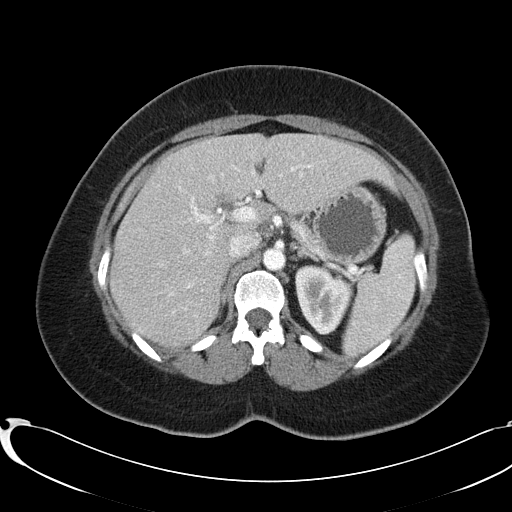
[im 67/82  soft-tissue]
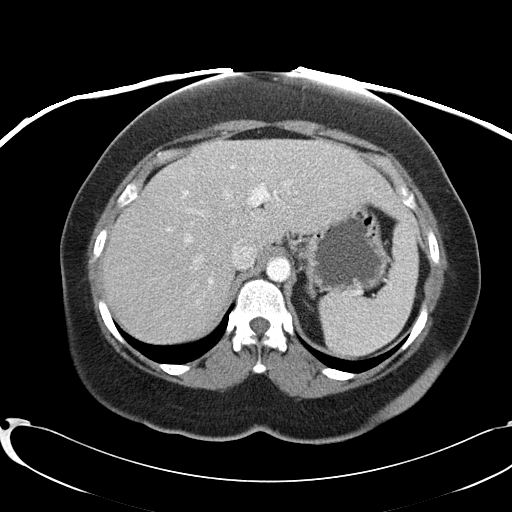
[im 72/82  soft-tissue]
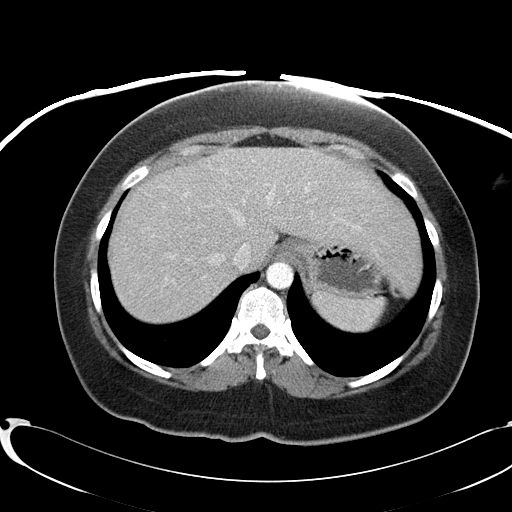
[im 77/82  soft-tissue]
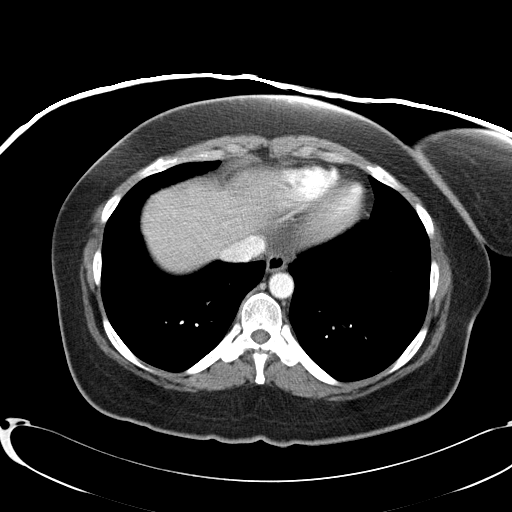

[Series 602: coronal abdomen · coronal · 0.83mm/px · 3 of 116 slices shown]
[im 39/116  soft-tissue]
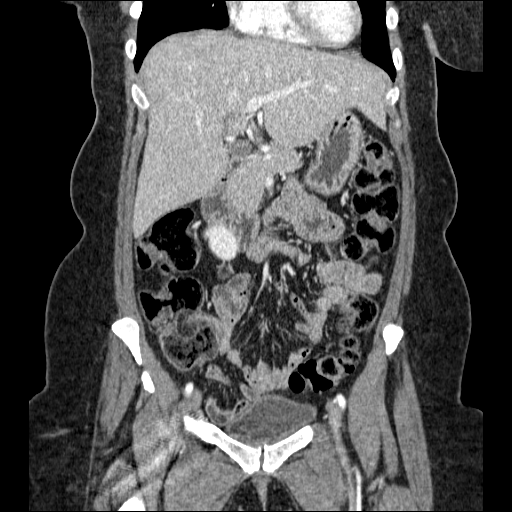
[im 52/116  soft-tissue]
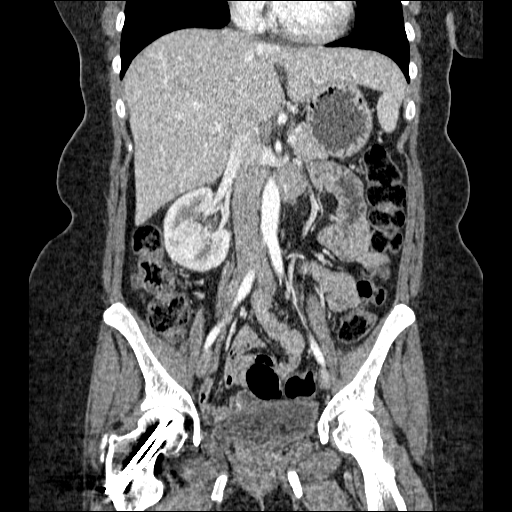
[im 64/116  soft-tissue]
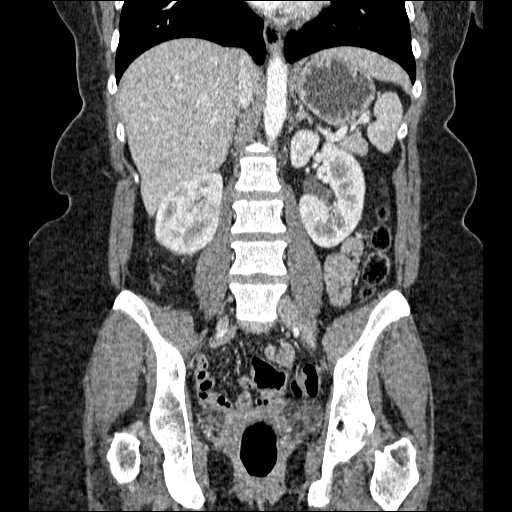

[17 of 46 positions shown; findings below may reference images not displayed]

FINDINGS: Lung bases are clear.  No effusions.  Heart is normal
size.

Liver, gallbladder, spleen, pancreas, adrenals, left kidney are
unremarkable.

There are abnormal areas of enhancement throughout the right kidney
compatible with pyelonephritis.  No focal fluid collection
currently to suggest abscess.  No hydronephrosis.  No renal or
ureteral stones.  Bladder is unremarkable.

Appendix is visualized and is normal.  Bowel is decompressed.  No
free fluid or free air.

Status post hysterectomy.  No adnexal masses.
IMPRESSION: Abnormal areas of enhancement throughout the right kidney
compatible with pyelonephritis.  No stones or hydronephrosis.

Normal appendix.

Prior hysterectomy.

## 2012-06-10 ENCOUNTER — Other Ambulatory Visit: Payer: Self-pay | Admitting: Family Medicine

## 2012-06-20 ENCOUNTER — Other Ambulatory Visit: Payer: Self-pay | Admitting: Family Medicine

## 2012-08-04 ENCOUNTER — Other Ambulatory Visit: Payer: Self-pay | Admitting: Family Medicine

## 2012-10-06 ENCOUNTER — Ambulatory Visit (INDEPENDENT_AMBULATORY_CARE_PROVIDER_SITE_OTHER): Payer: BC Managed Care – PPO | Admitting: Family Medicine

## 2012-10-06 ENCOUNTER — Encounter: Payer: Self-pay | Admitting: Family Medicine

## 2012-10-06 VITALS — BP 122/81 | HR 101 | Temp 98.3°F | Ht 66.0 in | Wt 203.5 lb

## 2012-10-06 DIAGNOSIS — E119 Type 2 diabetes mellitus without complications: Secondary | ICD-10-CM

## 2012-10-06 MED ORDER — AMOXICILLIN-POT CLAVULANATE 875-125 MG PO TABS: 1.0000 | ORAL_TABLET | Freq: Two times a day (BID) | ORAL | Status: DC

## 2012-10-06 NOTE — Patient Instructions (Addendum)
You seem to have a sinus infection. Start the antibiotic. You can try nasal saline or neti pot for congestion. If your symptoms do not improve in next week, then return to clinic.  Make appointment with Dr. Deirdre Priest for diabetes.   Sinusitis Sinusitis is redness, soreness, and swelling (inflammation) of the paranasal sinuses. Paranasal sinuses are air pockets within the bones of your face (beneath the eyes, the middle of the forehead, or above the eyes). In healthy paranasal sinuses, mucus is able to drain out, and air is able to circulate through them by way of your nose. However, when your paranasal sinuses are inflamed, mucus and air can become trapped. This can allow bacteria and other germs to grow and cause infection. Sinusitis can develop quickly and last only a short time (acute) or continue over a long period (chronic). Sinusitis that lasts for more than 12 weeks is considered chronic.  CAUSES  Causes of sinusitis include:  Allergies.  Structural abnormalities, such as displacement of the cartilage that separates your nostrils (deviated septum), which can decrease the air flow through your nose and sinuses and affect sinus drainage.  Functional abnormalities, such as when the small hairs (cilia) that line your sinuses and help remove mucus do not work properly or are not present. SYMPTOMS  Symptoms of acute and chronic sinusitis are the same. The primary symptoms are pain and pressure around the affected sinuses. Other symptoms include:  Upper toothache.  Earache.  Headache.  Bad breath.  Decreased sense of smell and taste.  A cough, which worsens when you are lying flat.  Fatigue.  Fever.  Thick drainage from your nose, which often is green and may contain pus (purulent).  Swelling and warmth over the affected sinuses. DIAGNOSIS  Your caregiver will perform a physical exam. During the exam, your caregiver may:  Look in your nose for signs of abnormal growths in  your nostrils (nasal polyps).  Tap over the affected sinus to check for signs of infection.  View the inside of your sinuses (endoscopy) with a special imaging device with a light attached (endoscope), which is inserted into your sinuses. If your caregiver suspects that you have chronic sinusitis, one or more of the following tests may be recommended:  Allergy tests.  Nasal culture A sample of mucus is taken from your nose and sent to a lab and screened for bacteria.  Nasal cytology A sample of mucus is taken from your nose and examined by your caregiver to determine if your sinusitis is related to an allergy. TREATMENT  Most cases of acute sinusitis are related to a viral infection and will resolve on their own within 10 days. Sometimes medicines are prescribed to help relieve symptoms (pain medicine, decongestants, nasal steroid sprays, or saline sprays).  However, for sinusitis related to a bacterial infection, your caregiver will prescribe antibiotic medicines. These are medicines that will help kill the bacteria causing the infection.  Rarely, sinusitis is caused by a fungal infection. In theses cases, your caregiver will prescribe antifungal medicine. For some cases of chronic sinusitis, surgery is needed. Generally, these are cases in which sinusitis recurs more than 3 times per year, despite other treatments. HOME CARE INSTRUCTIONS   Drink plenty of water. Water helps thin the mucus so your sinuses can drain more easily.  Use a humidifier.  Inhale steam 3 to 4 times a day (for example, sit in the bathroom with the shower running).  Apply a warm, moist washcloth to your face 3  to 4 times a day, or as directed by your caregiver.  Use saline nasal sprays to help moisten and clean your sinuses.  Take over-the-counter or prescription medicines for pain, discomfort, or fever only as directed by your caregiver. SEEK IMMEDIATE MEDICAL CARE IF:  You have increasing pain or severe  headaches.  You have nausea, vomiting, or drowsiness.  You have swelling around your face.  You have vision problems.  You have a stiff neck.  You have difficulty breathing. MAKE SURE YOU:   Understand these instructions.  Will watch your condition.  Will get help right away if you are not doing well or get worse. Document Released: 09/02/2005 Document Revised: 11/25/2011 Document Reviewed: 09/17/2011 [facility] Patient Information 2013 [place], [place].

## 2012-10-06 NOTE — Progress Notes (Signed)
  Subjective:     Theresa Miles is a 52 y.o. female who presents for evaluation of sinus pain. Symptoms include: congestion, cough, post nasal drip, purulent rhinorrhea, sinus pressure and sore throat and fatigue. Onset of symptoms was 14 days ago. Symptoms have been gradually worsening since that time. Past history is significant for diabetes and hypertension. Patient is a non-smoker.  She notes some increase in her CBGs.  Denies fever, chills, dyspnea, wheezing, hemoptysis, chest pain.   The following portions of the patient's history were reviewed and updated as appropriate: allergies, current medications, past family history, past medical history, past social history, past surgical history and problem list.  Review of Systems Pertinent items are noted in HPI.   Objective:    BP 122/81  Pulse 101  Temp 98.3 F (36.8 C) (Oral)  Ht 5\' 6"  (1.676 m)  Wt 203 lb 8 oz (92.307 kg)  BMI 32.85 kg/m2 General appearance: alert, cooperative and no distress Head: Normocephalic, without obvious abnormality, atraumatic Eyes: conjunctivae/corneas clear. PERRL, EOM's intact. Fundi benign. Ears: normal TM's and external ear canals both ears and bilateral ears with cerumen impacted, cleared and TM appear normal. Nose: mucoid discharge, mild congestion, sinus tenderness bilateral Throat: lips, mucosa, and tongue normal; teeth and gums normal Neck: no adenopathy Lungs: clear to auscultation bilaterally Heart: regular rate and rhythm, S1, S2 normal, no murmur, click, rub or gallop    Assessment:    Acute bacterial sinusitis.    Plan:    Nasal saline sprays. Augmentin per medication orders. discussed neti pot or mucinex for mucolytic.  Follow up in one week if not improving. Follow up with PCP for DM2 check. A1c drawn today prior to departure.

## 2012-10-07 ENCOUNTER — Ambulatory Visit (INDEPENDENT_AMBULATORY_CARE_PROVIDER_SITE_OTHER): Payer: BC Managed Care – PPO | Admitting: Family Medicine

## 2012-10-07 ENCOUNTER — Encounter: Payer: Self-pay | Admitting: Family Medicine

## 2012-10-07 ENCOUNTER — Other Ambulatory Visit: Payer: Self-pay | Admitting: Family Medicine

## 2012-10-07 VITALS — BP 122/82 | HR 69 | Temp 98.3°F | Ht 66.0 in | Wt 203.0 lb

## 2012-10-07 DIAGNOSIS — E119 Type 2 diabetes mellitus without complications: Secondary | ICD-10-CM

## 2012-10-07 DIAGNOSIS — I10 Essential (primary) hypertension: Secondary | ICD-10-CM

## 2012-10-07 DIAGNOSIS — Z23 Encounter for immunization: Secondary | ICD-10-CM

## 2012-10-07 MED ORDER — INSULIN PEN NEEDLE 29G X 12.7MM MISC: 1.0000 [IU] | Freq: Every day | Status: AC

## 2012-10-07 MED ORDER — INSULIN GLARGINE 100 UNIT/ML ~~LOC~~ SOLN: 10.0000 [IU] | Freq: Every day | SUBCUTANEOUS | Status: DC

## 2012-10-07 NOTE — Patient Instructions (Addendum)
Inject 10 u of Lantus once a day at the same time.   Increase by 1 unit every day your fasting blood sugar is > 150  Message me with your fasting readings next week  Stop the Actos  Continue metformin and glipizide  Come back in 2-3 weeks with all your medications and blood sugar readings and meter  Aim to lose 1-2 lb a week

## 2012-10-07 NOTE — Assessment & Plan Note (Signed)
Well controlled 

## 2012-10-07 NOTE — Progress Notes (Signed)
  Subjective:    Patient ID: Theresa Miles, female    DOB: 10-Jan-1961, 52 y.o.   MRN: [id number]  HPI  DIABETES Disease Monitoring: Blood Sugar ranges-250-300s for several months Polyuria/phagia/dipsia- yes      Visual problems- blurry Medications: Compliance- except for a few days in the last 4 months has been taking all 3 oral medications regularly Hypoglycemic symptoms- no  PMH - used to inject her father with insulin and feels she can do well.  Not interested in diabetes teaching  HYPERTENSION Disease Monitoring Home BP Monitoring not checking Chest pain- no    Dyspnea- no Medications Compliance-  Daily hctz. Lightheadedness-  no  Edema- no ROS - See HPI  PMH Lab Review   Potassium  Date Value Range Status  01/29/2012 4.2  3.5 - 5.3 mEq/L Final     Sodium  Date Value Range Status  01/29/2012 140  135 - 145 mEq/L Final     Creat  Date Value Range Status  01/29/2012 0.89  0.50 - 1.10 mg/dL Final     Creatinine, Ser  Date Value Range Status  04/15/2010 1.02  0.4 - 1.2 mg/dL Final      Edema -right resolved  Review of Symptoms - see HPI  PMH - Smoking status noted.     Review of Systems     Objective:   Physical Exam  Alert no acute distress Diabetic Foot Check -  Appearance - no lesions, ulcers or calluses Skin - no unusual pallor or redness Monofilament testing -  Right - Great toe, medial, central, lateral ball and posterior foot intact Left - Great toe, medial, central, lateral ball and posterior foot intact       Assessment & Plan:

## 2012-10-07 NOTE — Assessment & Plan Note (Signed)
Poorly controlled.  She agrees to start Lantus. Will start very low and monitor.

## 2012-11-10 ENCOUNTER — Encounter: Payer: Self-pay | Admitting: Family Medicine

## 2012-11-10 ENCOUNTER — Ambulatory Visit (INDEPENDENT_AMBULATORY_CARE_PROVIDER_SITE_OTHER): Payer: BC Managed Care – PPO | Admitting: Family Medicine

## 2012-11-10 VITALS — BP 115/83 | HR 85 | Temp 99.2°F | Ht 66.0 in | Wt 201.0 lb

## 2012-11-10 DIAGNOSIS — R6889 Other general symptoms and signs: Secondary | ICD-10-CM

## 2012-11-10 DIAGNOSIS — J111 Influenza due to unidentified influenza virus with other respiratory manifestations: Secondary | ICD-10-CM

## 2012-11-10 NOTE — Assessment & Plan Note (Signed)
ASSESSMENT: Flu like illness  PLAN: Symptomatic therapy suggested: rest, increase fluids, OTC acetaminophen, ibuprofen, gradual reintroduction of usual diet and call prn if symptoms persist or worsen. Call or return to clinic prn if these symptoms worsen or fail to improve as anticipated.

## 2012-11-10 NOTE — Progress Notes (Signed)
SUBJECTIVE:  Theresa Miles is a 52 y.o. female who present complaining of flu-like symptoms: fevers of 100-101, myalgias, congestion, sore throat, headache, diarrhea, decreased appetite and cough for 3 days. Denies dyspnea or wheezing, vomiting.  She did not receive a flu vaccine this year.    OBJECTIVE: Gen: Appears moderately ill but not toxic; temperature as noted in vitals.  HEENT:  Ears with wax impaction on L. Throat and pharynx normal.   Neck: supple. No adenopathy in the neck. Sinuses non tender.  Pulm: The chest is clear.

## 2012-11-10 NOTE — Patient Instructions (Addendum)
Viral Infections  A viral infection can be caused by different types of viruses.Most viral infections are not serious and resolve on their own. However, some infections may cause severe symptoms and may lead to further complications.  SYMPTOMS  Viruses can frequently cause:   Minor sore throat.   Aches and pains.   Headaches.   Runny nose.   Different types of rashes.   Watery eyes.   Tiredness.   Cough.   Loss of appetite.   Gastrointestinal infections, resulting in nausea, vomiting, and diarrhea.  These symptoms do not respond to antibiotics because the infection is not caused by bacteria. However, you might catch a bacterial infection following the viral infection. This is sometimes called a "superinfection." Symptoms of such a bacterial infection may include:   Worsening sore throat with pus and difficulty swallowing.   Swollen neck glands.   Chills and a high or persistent fever.   Severe headache.   Tenderness over the sinuses.   Persistent overall ill feeling (malaise), muscle aches, and tiredness (fatigue).   Persistent cough.   Yellow, green, or brown mucus production with coughing.  HOME CARE INSTRUCTIONS    Only take over-the-counter or prescription medicines for pain, discomfort, diarrhea, or fever as directed by your caregiver.   Drink enough water and fluids to keep your urine clear or pale yellow. Sports drinks can provide valuable electrolytes, sugars, and hydration.   Get plenty of rest and maintain proper nutrition. Soups and broths with crackers or rice are fine.  SEEK IMMEDIATE MEDICAL CARE IF:    You have severe headaches, shortness of breath, chest pain, neck pain, or an unusual rash.   You have uncontrolled vomiting, diarrhea, or you are unable to keep down fluids.   You or your child has an oral temperature above 102 F (38.9 C), not controlled by medicine.   Your baby is older than 3 months with a rectal temperature of 102 F (38.9 C) or higher.   Your baby is 3  months old or younger with a rectal temperature of 100.4 F (38 C) or higher.  MAKE SURE YOU:    Understand these instructions.   Will watch your condition.   Will get help right away if you are not doing well or get worse.  Document Released: 06/12/2005 Document Revised: 11/25/2011 Document Reviewed: 01/07/2011  ExitCare Patient Information 2013 [employer].

## 2012-12-27 ENCOUNTER — Other Ambulatory Visit: Payer: Self-pay | Admitting: Family Medicine

## 2013-01-31 ENCOUNTER — Other Ambulatory Visit: Payer: Self-pay | Admitting: Family Medicine

## 2013-03-25 ENCOUNTER — Other Ambulatory Visit: Payer: Self-pay

## 2013-04-05 ENCOUNTER — Encounter: Payer: BC Managed Care – PPO | Admitting: Family Medicine

## 2013-04-12 ENCOUNTER — Ambulatory Visit (INDEPENDENT_AMBULATORY_CARE_PROVIDER_SITE_OTHER): Payer: BC Managed Care – PPO | Admitting: Family Medicine

## 2013-04-12 ENCOUNTER — Encounter: Payer: Self-pay | Admitting: Family Medicine

## 2013-04-12 VITALS — BP 134/85 | HR 73 | Temp 98.5°F | Ht 66.0 in | Wt 201.0 lb

## 2013-04-12 DIAGNOSIS — I10 Essential (primary) hypertension: Secondary | ICD-10-CM

## 2013-04-12 DIAGNOSIS — E119 Type 2 diabetes mellitus without complications: Secondary | ICD-10-CM

## 2013-04-12 LAB — BASIC METABOLIC PANEL
CO2: 28 mEq/L (ref 19–32)
Calcium: 9.5 mg/dL (ref 8.4–10.5)
Chloride: 104 mEq/L (ref 96–112)
Creat: 0.76 mg/dL (ref 0.50–1.10)
Glucose, Bld: 146 mg/dL — ABNORMAL HIGH (ref 70–99)

## 2013-04-12 LAB — POCT GLYCOSYLATED HEMOGLOBIN (HGB A1C): Hemoglobin A1C: 8

## 2013-04-12 NOTE — Progress Notes (Signed)
  Subjective:    Patient ID: Theresa Miles, female    DOB: 12-15-1960, 52 y.o.   MRN: [id number]  HPI  DIABETES Disease Monitoring: Blood Sugar ranges-usually less than 200 Polyuria/phagia/dipsia- no      Visual problems- no Medications: Compliance- daily lantus at 8 units and metformin glipizide Hypoglycemic symptoms-  Blood sugar did get down to 60 when did not eat at night and took lantus 10 u  HYPERTENSION Disease Monitoring Home BP Monitoring not checking Chest pain- no    Dyspnea- no Medications Compliance-  daily hctz. Lightheadedness-  no  Edema- no ROS - See HPI  PMH Lab Review   Potassium  Date Value Range Status  01/29/2012 4.2  3.5 - 5.3 mEq/L Final     Sodium  Date Value Range Status  01/29/2012 140  135 - 145 mEq/L Final     Creat  Date Value Range Status  01/29/2012 0.89  0.50 - 1.10 mg/dL Final     Creatinine, Ser  Date Value Range Status  04/15/2010 1.02  0.4 - 1.2 mg/dL Final     Patient reports no  vision/ hearing changes,anorexia, weight change, fever ,adenopathy, persistant / recurrent hoarseness, swallowing issues, chest pain, edema,persistant / recurrent cough, hemoptysis, dyspnea(rest, exertional, paroxysmal nocturnal), gastrointestinal  bleeding (melena, rectal bleeding), abdominal pain, excessive heart burn, GU symptoms(dysuria, hematuria, pyuria, voiding/incontinence  Issues) syncope, focal weakness, severe memory loss, concerning skin lesions, depression, anxiety, abnormal bruising/bleeding, major joint swelling, breast masses or abnormal vaginal bleeding.     PMH - Smoking status noted.     Review of Systems     Objective:   Physical Exam Alert no acute distress Neck:  No deformities, thyromegaly, masses, or tenderness noted.   Supple with full range of motion without pain. Heart - Regular rate and rhythm.  No murmurs, gallops or rubs.    Lungs:  Normal respiratory effort, chest expands symmetrically. Lungs are clear to auscultation, no  crackles or wheezes. Extremities:  No cyanosis, edema, or deformity noted with good range of motion of all major joints.   Abdomen: soft and non-tender without masses, organomegaly or hernias noted.  No guarding or rebound Skin:  Intact without suspicious lesions or rashes        Assessment & Plan:

## 2013-04-12 NOTE — Assessment & Plan Note (Addendum)
Improved but not at goal.  Discussed regular meals and exercise is key.  She will try and adjust insulin as needed

## 2013-04-12 NOTE — Assessment & Plan Note (Signed)
Well controlled.  Continue hctz

## 2013-04-12 NOTE — Patient Instructions (Addendum)
For the diabetes try to eat regularly.  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  Otherwise I will send you a letter.  If you do not hear from me with in 2 weeks please call our office.      Have Fun

## 2013-04-13 ENCOUNTER — Encounter: Payer: Self-pay | Admitting: Family Medicine

## 2013-07-22 ENCOUNTER — Other Ambulatory Visit: Payer: Self-pay

## 2013-10-14 ENCOUNTER — Other Ambulatory Visit: Payer: Self-pay | Admitting: Family Medicine

## 2013-10-24 ENCOUNTER — Other Ambulatory Visit: Payer: Self-pay | Admitting: Family Medicine

## 2013-11-15 ENCOUNTER — Ambulatory Visit: Payer: BC Managed Care – PPO | Admitting: Family Medicine

## 2013-11-15 ENCOUNTER — Encounter: Payer: Self-pay | Admitting: Family Medicine

## 2013-11-28 ENCOUNTER — Other Ambulatory Visit: Payer: Self-pay | Admitting: Family Medicine

## 2013-12-23 ENCOUNTER — Other Ambulatory Visit: Payer: Self-pay

## 2013-12-23 ENCOUNTER — Telehealth: Payer: Self-pay | Admitting: Family Medicine

## 2013-12-23 MED ORDER — METFORMIN HCL 1000 MG PO TABS: ORAL_TABLET | ORAL | Status: DC

## 2013-12-23 NOTE — Telephone Encounter (Signed)
Refill request for Metformin. Patient completely out. Has made an appt to see Dr. Erin Hearing upon his return on May 20. Needs enough meds to last her until appt.

## 2013-12-23 NOTE — Telephone Encounter (Signed)
Will forward to Dr. McDiarmid who is covering for Dr. Erin Hearing. Jazmin Hartsell,CMA

## 2013-12-23 NOTE — Telephone Encounter (Signed)
Will try and contact again.

## 2013-12-23 NOTE — Telephone Encounter (Signed)
Tried to call pt and let her know that rx was sent to pharmacy but there was no answer.  Jazmin Hartsell,CMA

## 2013-12-23 NOTE — Telephone Encounter (Signed)
Dr Dilraj Killgore Refilled metformin

## 2013-12-24 ENCOUNTER — Other Ambulatory Visit: Payer: Self-pay | Admitting: Family Medicine

## 2014-01-08 ENCOUNTER — Other Ambulatory Visit: Payer: Self-pay | Admitting: Family Medicine

## 2014-02-02 ENCOUNTER — Encounter: Payer: Self-pay | Admitting: Family Medicine

## 2014-02-02 ENCOUNTER — Ambulatory Visit (INDEPENDENT_AMBULATORY_CARE_PROVIDER_SITE_OTHER): Payer: BC Managed Care – PPO | Admitting: Family Medicine

## 2014-02-02 VITALS — BP 131/84 | HR 73 | Temp 98.1°F | Wt 204.0 lb

## 2014-02-02 DIAGNOSIS — E119 Type 2 diabetes mellitus without complications: Secondary | ICD-10-CM

## 2014-02-02 DIAGNOSIS — I10 Essential (primary) hypertension: Secondary | ICD-10-CM

## 2014-02-02 LAB — POCT GLYCOSYLATED HEMOGLOBIN (HGB A1C): HEMOGLOBIN A1C: 8.5

## 2014-02-02 MED ORDER — SITAGLIPTIN PHOSPHATE 50 MG PO TABS: 50.0000 mg | ORAL_TABLET | Freq: Every day | ORAL | Status: DC

## 2014-02-02 NOTE — Patient Instructions (Signed)
Good to see you today!  Thanks for coming in.  Stop lantus start Januvia - increase as tolerated  Will refer you to our dietician - Dr Jenne Campus  Mammogram  Come back in 3 months  Consider a colonoscopy in the fall

## 2014-02-02 NOTE — Assessment & Plan Note (Signed)
Well controlled 

## 2014-02-02 NOTE — Assessment & Plan Note (Addendum)
Worsening.  Her A1c does not really fit her reported fasting labs may be having hyperglycemia later in day.  Puzzling she is not losing weight if really sticking to diets.  She agrees to see nutritionist although relates she has before.   Will stop her lantus since she is convinced causes weight gain and start Januvia.  Decided if this does not work may need to go back to insulin

## 2014-02-02 NOTE — Progress Notes (Signed)
   Subjective:    Patient ID: Theresa Miles, female    DOB: August 28, 1961, 53 y.o.   MRN: [id number]  HPI  HYPERTENSION Disease Monitoring: Blood pressure range-not checking Chest pain, palpitations- no      Dyspnea- no Medications: Compliance- daily Lightheadedness,Syncope- no   Edema- no  DIABETES Disease Monitoring: Blood Sugar ranges-recalls her blood sugar are 75-100 when taking lantus 10 mg daily Polyuria/phagia/dipsia- no      Visual problems- no Medications: Compliance- cut back on lantus to 10 u when was having low blood sugar.  Has tried 1100 and 1400 calorie per day diets without weight loss.  She is pretty certain she has stuck th these well Hypoglycemic symptoms- when was on 1100 diet  ROS See HPI above   PMH Smoking Status noted  Lab Review   Potassium  Date Value Ref Range Status  04/12/2013 3.8  3.5 - 5.3 mEq/L Final     Sodium  Date Value Ref Range Status  04/12/2013 140  135 - 145 mEq/L Final     Creat  Date Value Ref Range Status  04/12/2013 0.76  0.50 - 1.10 mg/dL Final     Creatinine, Ser  Date Value Ref Range Status  04/15/2010 1.02  0.4 - 1.2 mg/dL Final        Review of Systems     Objective:   Physical Exam No acute distress No weight change       Assessment & Plan:

## 2014-02-17 ENCOUNTER — Other Ambulatory Visit: Payer: Self-pay | Admitting: Family Medicine

## 2014-03-09 ENCOUNTER — Telehealth: Payer: Self-pay | Admitting: Family Medicine

## 2014-03-09 NOTE — Telephone Encounter (Signed)
Called Pt about DM care. Pt reports that PCP wants her to come back in August. Unable to access PCP calendar that far out at this time. When Pt or PCP calls to make appointment, please schedule to check LDL and A1C.

## 2014-04-10 ENCOUNTER — Other Ambulatory Visit: Payer: Self-pay | Admitting: Family Medicine

## 2014-06-21 LAB — HM DIABETES EYE EXAM

## 2014-07-25 ENCOUNTER — Ambulatory Visit (INDEPENDENT_AMBULATORY_CARE_PROVIDER_SITE_OTHER): Payer: BC Managed Care – PPO | Admitting: Family Medicine

## 2014-07-25 ENCOUNTER — Encounter: Payer: Self-pay | Admitting: Family Medicine

## 2014-07-25 VITALS — BP 117/79 | HR 65 | Temp 98.1°F | Ht 66.0 in | Wt 207.0 lb

## 2014-07-25 DIAGNOSIS — I10 Essential (primary) hypertension: Secondary | ICD-10-CM

## 2014-07-25 DIAGNOSIS — E669 Obesity, unspecified: Secondary | ICD-10-CM

## 2014-07-25 DIAGNOSIS — E119 Type 2 diabetes mellitus without complications: Secondary | ICD-10-CM

## 2014-07-25 DIAGNOSIS — Z1211 Encounter for screening for malignant neoplasm of colon: Secondary | ICD-10-CM

## 2014-07-25 LAB — POCT GLYCOSYLATED HEMOGLOBIN (HGB A1C): HEMOGLOBIN A1C: 8.4

## 2014-07-25 NOTE — Assessment & Plan Note (Signed)
At goal.  

## 2014-07-25 NOTE — Patient Instructions (Signed)
Good to see you today!  Thanks for coming in.  Diabetes Increase lantus 1 unit each AM that your blood sugar is > 120.  Up to 25 units  Send in your blood sugar am readings via my chart every week  Get your mamogram and send me the results  [place] for a colonoscopy  Come in for a fasting cholesterol - Come in Fasting

## 2014-07-25 NOTE — Assessment & Plan Note (Signed)
Not at goal.  Will try gradual increase in Lantus and move her glipizide to taking in the AM

## 2014-07-25 NOTE — Progress Notes (Signed)
   Subjective:    Patient ID: Theresa Miles, female    DOB: Dec 22, 1960, 53 y.o.   MRN: [id number]  HPI  DIABETES Disease Monitoring: Blood Sugar ranges-usally around 120 Polyuria/phagia/dipsia- no      Visual problems- no Medications: Compliance- did not start Januvia.  Still taking Lantus 16 u and glipzide and metoformin Hypoglycemic symptoms- occasional in 60s in AM  HYPERTENSION Disease Monitoring Home BP Monitoring not Chest pain- no    Dyspnea- no Medications Compliance-  hctz. Lightheadedness-  no  Edema- no ROS - See HPI  PMH Lab Review   POTASSIUM  Date Value Ref Range Status  04/12/2013 3.8 3.5 - 5.3 mEq/L Final   SODIUM  Date Value Ref Range Status  04/12/2013 140 135 - 145 mEq/L Final   CREAT  Date Value Ref Range Status  04/12/2013 0.76 0.50 - 1.10 mg/dL Final   CREATININE, SER  Date Value Ref Range Status  04/15/2010 1.02 0.4 - 1.2 mg/dL Final       Chief Complaint noted Review of Symptoms - see HPI PMH - Smoking status noted.   Vital Signs reviewed      Review of Systems     Objective:   Physical Exam  Alert no acute distress Diabetic Foot Check -  Appearance - no lesions, ulcers or calluses Skin - no unusual pallor or redness Monofilament testing -  Right - Great toe, medial, central, lateral ball and posterior foot intact Left - Great toe, medial, central, lateral ball and posterior foot intact       Assessment & Plan:

## 2014-07-25 NOTE — Assessment & Plan Note (Signed)
Going to a weight loss center.  We discussed Rx weight loss medications and I told her we do not prescribe them here

## 2014-08-02 ENCOUNTER — Encounter: Payer: Self-pay | Admitting: Family Medicine

## 2014-08-16 ENCOUNTER — Telehealth: Payer: Self-pay | Admitting: *Deleted

## 2014-08-16 NOTE — Telephone Encounter (Signed)
Received a fax from [facility] stating pt informed them she use Contour Next Test strips.  Accu-Check test strips are listed on current med list.  Please advise.  Derl Barrow, RN

## 2014-08-17 NOTE — Telephone Encounter (Signed)
Verbal order given by Dr. Erin Hearing for Contour Next test strips.  #100, Test once daily and PRN refills given to [facility].  Derl Barrow, RN

## 2014-08-17 NOTE — Telephone Encounter (Signed)
She might have changed meters - I dont recall.  Ok to send in Contour Next or I can  Thanks  [place]

## 2014-08-25 ENCOUNTER — Encounter: Payer: Self-pay | Admitting: Family Medicine

## 2014-09-29 ENCOUNTER — Other Ambulatory Visit: Payer: Self-pay | Admitting: Family Medicine

## 2014-10-31 ENCOUNTER — Other Ambulatory Visit: Payer: Self-pay | Admitting: Family Medicine

## 2014-11-29 ENCOUNTER — Ambulatory Visit: Payer: Self-pay | Admitting: Family Medicine

## 2014-12-01 ENCOUNTER — Encounter: Payer: Self-pay | Admitting: Family Medicine

## 2014-12-16 ENCOUNTER — Encounter: Payer: Self-pay | Admitting: Family Medicine

## 2015-01-19 ENCOUNTER — Other Ambulatory Visit: Payer: Self-pay | Admitting: Family Medicine

## 2015-03-13 ENCOUNTER — Other Ambulatory Visit: Payer: Self-pay

## 2015-03-15 ENCOUNTER — Other Ambulatory Visit: Payer: Self-pay | Admitting: Family Medicine

## 2015-05-18 ENCOUNTER — Other Ambulatory Visit: Payer: Self-pay | Admitting: Family Medicine

## 2015-07-02 ENCOUNTER — Other Ambulatory Visit: Payer: Self-pay | Admitting: Family Medicine

## 2015-07-28 ENCOUNTER — Other Ambulatory Visit: Payer: Self-pay | Admitting: Family Medicine

## 2015-09-04 ENCOUNTER — Encounter: Payer: Self-pay | Admitting: Family Medicine

## 2015-09-04 ENCOUNTER — Ambulatory Visit (INDEPENDENT_AMBULATORY_CARE_PROVIDER_SITE_OTHER): Payer: BLUE CROSS/BLUE SHIELD | Admitting: Family Medicine

## 2015-09-04 VITALS — BP 130/87 | HR 72 | Temp 97.6°F | Ht 66.0 in | Wt 213.0 lb

## 2015-09-04 DIAGNOSIS — L723 Sebaceous cyst: Secondary | ICD-10-CM | POA: Diagnosis not present

## 2015-09-04 NOTE — Assessment & Plan Note (Signed)
Removed in office today. Please see procedure note above. Pathology pending.

## 2015-09-04 NOTE — Progress Notes (Signed)
    Subjective:  Theresa Miles is a 54 y.o. female who presents to the [facility] today for same day appointment with a chief complaint of left shoulder mass.   HPI:  Left shoulder mass First noticed 6-8 months ago. Located on the superior aspect of her left shoulder. Did not think about it much until 1 week ago when it started becoming painful. It has drained thick, yellow material in the past. Has gotten somewhat bigger over the last couple of months. No redness. No fevers or chills. No other similar lesions. Has not tried any treatments. Is more irritated because it is in the area where her bra strap normally rests.   ROS: Per HPI  Objective:  Physical Exam: BP 130/87 mmHg  Pulse 72  Temp(Src) 97.6 F (36.4 C) (Oral)  Ht 5\' 6"  (1.676 m)  Wt 213 lb (96.616 kg)  BMI 34.40 kg/m2  Gen: NAD, resting comfortably MSK: no edema, cyanosis, or clubbing noted Skin: 1-1.5cm mass located on superior aspect of left shoulder. No erythema. Freely mobile.  Neuro: grossly normal, moves all extremities Psych: Normal affect and thought content  Sebaceous Cyst Excision Procedure Note  Pre-operative Diagnosis: Sebaceous Cyst  Post-operative Diagnosis: same  Locations:superior left shoulder  Indications: Diagnostic, therapeutic  Anesthesia: Lidocaine 1% with epinephrine without added sodium bicarbonate  Procedure Details  History of allergy to iodine: no  Patient informed of the risks (including bleeding and infection) and benefits of the  procedure and Written informed consent obtained.  The lesion and surrounding area was given a sterile prep using betadyne and draped in the usual sterile fashion. A 10mm incision was made over the cyst, which was dissected free of the surrounding tissue and removed.  The cyst was filled with typical sebaceous material.  The wound was closed with steri-strips. Antibiotic ointment and a sterile dressing applied.  The specimen was sent for pathologic examination.  The patient tolerated the procedure well.  EBL: 1 ml  Condition: Stable  Complications: none.  Assessment/Plan:  Sebaceous cyst Removed in office today. Please see procedure note above. Pathology pending.   Algis Greenhouse. Jerline Pain, [place] Resident PGY-2 09/04/2015 3:45 PM

## 2015-09-04 NOTE — Patient Instructions (Signed)
Sebaceous Cyst Removal Sebaceous cyst removal is a procedure to remove a sac of oily material that forms under your skin (sebaceous cyst). Sebaceous cysts may also be called epidermoid cysts or keratin cysts. Normally, the skin secretes this oily material through a gland or a hair follicle. This type of cyst usually results when a skin gland or hair follicle becomes blocked. You may need this procedure if you have a sebaceous cyst that becomes large, uncomfortable, or infected. LET YOUR HEALTH CARE PROVIDER KNOW ABOUT:  Any allergies you have.  All medicines you are taking, including vitamins, herbs, eye drops, creams, and over-the-counter medicines.  Previous problems you or members of your family have had with the use of anesthetics.  Any blood disorders you have.  Previous surgeries you have had.  Medical conditions you have. RISKS AND COMPLICATIONS Generally, this is a safe procedure. However, problems may occur, including:  Developing another cyst.  Bleeding.  Infection.  Scarring. BEFORE THE PROCEDURE  Ask your health care provider about:  Changing or stopping your regular medicines. This is especially important if you are taking diabetes medicines or blood thinners.  Taking medicines such as aspirin and ibuprofen. These medicines can thin your blood. Do not take these medicines before your procedure if your health care provider instructs you not to.  If you have an infected cyst, you may have to take antibiotic medicines before or after the cyst removal. Take your antibiotics as directed by your health care provider. Finish all of the medicine even if you start to feel better.  Take a shower on the morning of your procedure. Your health care provider may ask you to use a germ-killing (antiseptic) soap. PROCEDURE  You will be given a medicine that numbs the area (local anesthetic).  The skin around the cyst will be cleaned with a germ-killing solution  (antiseptic).  Your health care provider will make a small surgical incision over the cyst.  The cyst will be separated from the surrounding tissues that are under your skin.  If possible, the cyst will be removed undamaged (intact).  If the cyst bursts (ruptures), it will need to be removed in pieces.  After the cyst is removed, your health care provider will control any bleeding and close the incision with small stitches (sutures). Small incisions may not need sutures, and the bleeding will be controlled by applying direct pressure with gauze.  Your health care provider may apply antibiotic ointment and a light bandage (dressing) over the incision. This procedure may vary among health care providers and hospitals. AFTER THE PROCEDURE  If your cyst ruptured during surgery, you may need to take antibiotic medicine. If you were prescribed an antibiotic medicine, finish all of it even if you start to feel better.   This information is not intended to replace advice given to you by your health care provider. Make sure you discuss any questions you have with your health care provider.   Document Released: 08/30/2000 Document Revised: 09/23/2014 Document Reviewed: 05/18/2014 Elsevier Interactive Patient Education 2016 [employer]  

## 2015-09-05 ENCOUNTER — Encounter: Payer: Self-pay | Admitting: Family Medicine

## 2015-09-09 ENCOUNTER — Other Ambulatory Visit: Payer: Self-pay | Admitting: Family Medicine

## 2015-10-06 ENCOUNTER — Other Ambulatory Visit: Payer: Self-pay | Admitting: Family Medicine

## 2015-10-17 ENCOUNTER — Other Ambulatory Visit: Payer: Self-pay | Admitting: Family Medicine

## 2015-11-01 ENCOUNTER — Encounter: Payer: Self-pay | Admitting: Family Medicine

## 2015-11-01 ENCOUNTER — Ambulatory Visit (INDEPENDENT_AMBULATORY_CARE_PROVIDER_SITE_OTHER): Payer: BLUE CROSS/BLUE SHIELD | Admitting: Family Medicine

## 2015-11-01 ENCOUNTER — Other Ambulatory Visit: Payer: Self-pay | Admitting: Orthopedic Surgery

## 2015-11-01 VITALS — BP 125/73 | HR 74 | Temp 98.1°F | Ht 66.0 in | Wt 206.0 lb

## 2015-11-01 DIAGNOSIS — M67911 Unspecified disorder of synovium and tendon, right shoulder: Secondary | ICD-10-CM

## 2015-11-01 DIAGNOSIS — I10 Essential (primary) hypertension: Secondary | ICD-10-CM

## 2015-11-01 DIAGNOSIS — J069 Acute upper respiratory infection, unspecified: Secondary | ICD-10-CM

## 2015-11-01 DIAGNOSIS — Z1159 Encounter for screening for other viral diseases: Secondary | ICD-10-CM

## 2015-11-01 DIAGNOSIS — E119 Type 2 diabetes mellitus without complications: Secondary | ICD-10-CM

## 2015-11-01 DIAGNOSIS — Z114 Encounter for screening for human immunodeficiency virus [HIV]: Secondary | ICD-10-CM

## 2015-11-01 DIAGNOSIS — M67912 Unspecified disorder of synovium and tendon, left shoulder: Secondary | ICD-10-CM

## 2015-11-01 LAB — CBC
HEMATOCRIT: 36.7 % (ref 36.0–46.0)
Hemoglobin: 12.4 g/dL (ref 12.0–15.0)
MCH: 26.6 pg (ref 26.0–34.0)
MCHC: 33.8 g/dL (ref 30.0–36.0)
MCV: 78.6 fL (ref 78.0–100.0)
MPV: 9.4 fL (ref 8.6–12.4)
Platelets: 456 10*3/uL — ABNORMAL HIGH (ref 150–400)
RBC: 4.67 MIL/uL (ref 3.87–5.11)
RDW: 14.5 % (ref 11.5–15.5)
WBC: 7.7 10*3/uL (ref 4.0–10.5)

## 2015-11-01 LAB — POCT GLYCOSYLATED HEMOGLOBIN (HGB A1C): HEMOGLOBIN A1C: 10

## 2015-11-01 MED ORDER — GLIPIZIDE 10 MG PO TABS: 10.0000 mg | ORAL_TABLET | Freq: Every day | ORAL | Status: DC

## 2015-11-01 MED ORDER — INSULIN GLARGINE 100 UNIT/ML SOLOSTAR PEN: 20.0000 [IU] | PEN_INJECTOR | Freq: Every day | SUBCUTANEOUS | Status: DC

## 2015-11-01 NOTE — Assessment & Plan Note (Signed)
Poor control.  See after visit summary.   Will gradually increase Lantus.   May eventually drop glipizide

## 2015-11-01 NOTE — Assessment & Plan Note (Signed)
Good control continue current medications  

## 2015-11-01 NOTE — Progress Notes (Signed)
   Subjective:    Patient ID: Theresa Miles, female    DOB: 06-13-1961, 55 y.o.   MRN: [id number]  HPI HYPERTENSION Disease Monitoring: Blood pressure range-not checing Chest pain, palpitations- no      Dyspnea- no Medications: Compliance- daily HCTZ Lightheadedness,Syncope- no   Edema- no  DIABETES Disease Monitoring: Blood Sugar ranges-not checked for weeks Polyuria/phagia/dipsia- no      Visual problems- no Medications: Compliance- taking all medications  Hypoglycemic symptoms- no  URI For last 2 weeks nonproductive cough and scratchy throat. Not used any medications.  Sick grandson.  No fever or shortness of breath or sputum or rash   Monitoring Labs and Parameters Last A1C:  Lab Results  Component Value Date   HGBA1C 10 11/01/2015    Last Lipid:     Component Value Date/Time   CHOL 164 12/11/2011 0926   HDL 58 12/11/2011 0926    Last Bmet  POTASSIUM  Date Value Ref Range Status  04/12/2013 3.8 3.5 - 5.3 mEq/L Final   SODIUM  Date Value Ref Range Status  04/12/2013 140 135 - 145 mEq/L Final   CREAT  Date Value Ref Range Status  04/12/2013 0.76 0.50 - 1.10 mg/dL Final   CREATININE, SER  Date Value Ref Range Status  04/15/2010 1.02 0.4 - 1.2 mg/dL Final      Last BPs:  BP Readings from Last 3 Encounters:  11/01/15 125/73  09/04/15 130/87  07/25/14 117/79    Chief Complaint noted Review of Symptoms - see HPI PMH - Smoking status noted.   Vital Signs reviewed     Review of Systems     Objective:   Physical Exam  Heart - Regular rate and rhythm.  No murmurs, gallops or rubs.    Lungs:  Normal respiratory effort, chest expands symmetrically. Lungs are clear to auscultation, no crackles or wheezes. Neck:  No deformities, thyromegaly, masses, or tenderness noted.   Supple with full range of motion without pain. Ears:  External ear exam shows no significant lesions or deformities.  Otoscopic examination reveals clear canals, tympanic membranes  are intact bilaterally without bulging, retraction, inflammation or discharge. Hearing is grossly normal bilaterall Extremities:  No cyanosis, edema, or deformity noted with good range of motion of all major joints.   Mouth - no lesions, mucous membranes are moist, no decaying teeth  Throat: normal mucosa, no exudate, uvula midline, no redness       Assessment & Plan:   URI - likely viral.  See after visit summary for plans

## 2015-11-01 NOTE — Patient Instructions (Signed)
Good to see you today!  Thanks for coming in.  For your URI  Afrin  Nasal spray twice daily for three days - 2 spray each nostril   Nasal Saline at least 5-6 times a day - 2 spray each nostril   If you are not better in 3-4 days call and I will send in an antibiotics   Diabetes  Continue your medications as you are with the glipizide and metformin  For Lantus - check your blood sugar every morning - if it is > 150 then go up by one unit every AM until it is reliably below 150  Write down or record the readings and bring with you next visit  Bring all your medications and readings and meter next visit  I will call you if your lab tests are not normal.  Otherwise we will discuss them at your next visit.  Come back in 4-6 weeks.  But call with any questions or concerns

## 2015-11-02 LAB — COMPREHENSIVE METABOLIC PANEL
ALT: 13 U/L (ref 6–29)
AST: 13 U/L (ref 10–35)
Albumin: 3.8 g/dL (ref 3.6–5.1)
Alkaline Phosphatase: 81 U/L (ref 33–130)
BUN: 13 mg/dL (ref 7–25)
CHLORIDE: 105 mmol/L (ref 98–110)
CO2: 29 mmol/L (ref 20–31)
CREATININE: 0.82 mg/dL (ref 0.50–1.05)
Calcium: 9.3 mg/dL (ref 8.6–10.4)
Glucose, Bld: 122 mg/dL — ABNORMAL HIGH (ref 65–99)
Potassium: 4.2 mmol/L (ref 3.5–5.3)
SODIUM: 142 mmol/L (ref 135–146)
Total Bilirubin: 0.3 mg/dL (ref 0.2–1.2)
Total Protein: 7 g/dL (ref 6.1–8.1)

## 2015-11-02 LAB — HEPATITIS C ANTIBODY: HCV Ab: NEGATIVE

## 2015-11-02 LAB — HIV ANTIBODY (ROUTINE TESTING W REFLEX): HIV 1&2 Ab, 4th Generation: NONREACTIVE

## 2015-11-13 ENCOUNTER — Telehealth: Payer: Self-pay | Admitting: *Deleted

## 2015-11-13 ENCOUNTER — Other Ambulatory Visit: Payer: Self-pay | Admitting: *Deleted

## 2015-11-13 MED ORDER — METFORMIN HCL 1000 MG PO TABS: ORAL_TABLET | ORAL | Status: DC

## 2015-11-13 MED ORDER — METFORMIN HCL 1000 MG PO TABS: 1500.0000 mg | ORAL_TABLET | Freq: Every day | ORAL | Status: DC

## 2015-11-13 NOTE — Telephone Encounter (Signed)
Merrilee Seashore, Pharmacist with [facility] called needing clarification in the directions and quantity of Metformin.  Old directions stated 1 tablet in AM and 1.5 tablet in PM.  New directions stated 1 daily with quantity of 75 (more than 30 day supply). Please call [phone] or send in old directions.  Derl Barrow, RN

## 2015-11-13 NOTE — Telephone Encounter (Signed)
Corrected Metform sig 1.5 tab in morning and 1 tab in evening Disp#75

## 2015-11-20 ENCOUNTER — Other Ambulatory Visit: Payer: Self-pay | Admitting: *Deleted

## 2015-11-20 MED ORDER — GLUCOSE BLOOD VI STRP
ORAL_STRIP | Status: DC
Start: 2015-11-20 — End: 2015-11-21

## 2015-11-21 NOTE — Telephone Encounter (Signed)
Please send the testing directions for the test strips.  Use as Directed is not accepted for insurance billing.  Derl Barrow, RN

## 2015-11-21 NOTE — Addendum Note (Signed)
Addended by: Derl Barrow on: 11/21/2015 09:33 AM   Modules accepted: Orders

## 2015-11-22 MED ORDER — GLUCOSE BLOOD VI STRP: ORAL_STRIP | Status: DC

## 2015-11-28 ENCOUNTER — Other Ambulatory Visit: Payer: Self-pay | Admitting: *Deleted

## 2015-11-28 MED ORDER — HYDROCHLOROTHIAZIDE 25 MG PO TABS: 25.0000 mg | ORAL_TABLET | Freq: Every day | ORAL | Status: DC

## 2015-12-16 ENCOUNTER — Other Ambulatory Visit: Payer: Self-pay | Admitting: Family Medicine

## 2016-01-01 ENCOUNTER — Other Ambulatory Visit: Payer: Self-pay | Admitting: *Deleted

## 2016-01-01 MED ORDER — GLIPIZIDE 10 MG PO TABS: 10.0000 mg | ORAL_TABLET | Freq: Every day | ORAL | Status: DC

## 2016-01-24 ENCOUNTER — Ambulatory Visit (INDEPENDENT_AMBULATORY_CARE_PROVIDER_SITE_OTHER): Payer: BLUE CROSS/BLUE SHIELD | Admitting: Family Medicine

## 2016-01-24 ENCOUNTER — Encounter: Payer: Self-pay | Admitting: Family Medicine

## 2016-01-24 VITALS — BP 110/70 | HR 84 | Temp 98.3°F | Ht 66.0 in | Wt 207.0 lb

## 2016-01-24 DIAGNOSIS — E119 Type 2 diabetes mellitus without complications: Secondary | ICD-10-CM

## 2016-01-24 DIAGNOSIS — H9203 Otalgia, bilateral: Secondary | ICD-10-CM | POA: Diagnosis not present

## 2016-01-24 LAB — POCT GLYCOSYLATED HEMOGLOBIN (HGB A1C): Hemoglobin A1C: 9

## 2016-01-24 MED ORDER — LORATADINE 10 MG PO TABS: 10.0000 mg | ORAL_TABLET | Freq: Every day | ORAL | Status: DC

## 2016-01-24 NOTE — Assessment & Plan Note (Signed)
Not at goal.  See after visit summary.  Discussed need for adding a new oral medication or multiple insulin doses.  She wants to try alternatives first

## 2016-01-24 NOTE — Progress Notes (Signed)
   Subjective:    Patient ID: Theresa Miles, female    DOB: [birth year], 55 y.o.   MRN: [id number]  HPI   DIABETES Disease Monitoring: Blood Sugar ranges(Severity) -has been out of strips for 2 weeks.  Her fastings had been usually < 150 with occsl 60s  Thinks her blood sugar are high afer her one big meal a day Associated Symptoms- Polyuria/phagia/dipsia- no      Visual problems- no Medications: Compliance(Modifying factor) - taking all as perscribed.  Up to 23 u lantus Hypoglycemic symptoms- no Timing - continuous  EAR PAIN  Location: bilateral Ear pain started: 2 weeks ago.  Thinks maybe related to allergies.  Started a mild cold 2 days ago Pain is: achy Severity: moderate Medications tried: none Recent ear trauma: no Prior ear surgeries: no Antibiotics in the last 30 days: no History of diabetes: yes  Symptoms Ear discharge: no Fever: no Pain with chewing: no Ringing in ears: no Dizziness: feels stuffy Hearing loss: mild Rashes or blisters around ear: no Weight loss: no  Review of Symptoms - see HPI PMH - Smoking status noted.    Chief Complaint noted Review of Symptoms - see HPI PMH - Smoking status noted.   Vital Signs reviewed     Review of Systems     Objective:   Physical Exam  Ears:  External ear exam shows no significant lesions or deformities.  Otoscopic examination reveals cloudy canals, tympanic membranes are intact bilaterally without bulging, retraction, inflammation or discharge. Hearing is grossly normal bilaterall Mouth - no lesions, mucous membranes are moist, no decaying teeth  Neck:  No deformities, thyromegaly, masses, or tenderness noted.   Supple with full range of motion without pain.       Assessment & Plan:    Ear Pain - consistent with eustachain tube dysfunction due to allergies.  Start clariten

## 2016-01-24 NOTE — Patient Instructions (Signed)
Good to see you today!  Thanks for coming in.  Come back in 1 month  Goal is to  Exercise - 30 min 5 times a week - Start Fitbit monitoring - will bring    Eat healthier - food diary on web or fitbit -    Lose weight -  Weigh 202 - monitor your weight once a week   Monitor blood sugar - fasting every AM and once after lunch   - if Fasting blood sugar are regularly > 100 then increase your insulin by one unit   Try Loratadine for the ears - If you have fever or worsening pain or congestion then call

## 2016-01-29 ENCOUNTER — Telehealth: Payer: Self-pay | Admitting: *Deleted

## 2016-01-29 NOTE — Telephone Encounter (Signed)
Received fax refill request for BD U/F mini pen needle [id number].  Fax form placed in provider box for review. Derl Barrow, RN

## 2016-02-13 ENCOUNTER — Other Ambulatory Visit: Payer: Self-pay | Admitting: *Deleted

## 2016-02-13 MED ORDER — METFORMIN HCL 1000 MG PO TABS: ORAL_TABLET | ORAL | Status: DC

## 2016-02-19 ENCOUNTER — Other Ambulatory Visit: Payer: Self-pay | Admitting: *Deleted

## 2016-02-19 MED ORDER — GLIPIZIDE 10 MG PO TABS: 10.0000 mg | ORAL_TABLET | Freq: Every day | ORAL | Status: DC

## 2016-06-04 ENCOUNTER — Other Ambulatory Visit: Payer: Self-pay | Admitting: *Deleted

## 2016-06-04 ENCOUNTER — Other Ambulatory Visit: Payer: Self-pay | Admitting: Family Medicine

## 2016-06-04 MED ORDER — INSULIN GLARGINE 100 UNIT/ML SOLOSTAR PEN: 20.0000 [IU] | PEN_INJECTOR | Freq: Every day | SUBCUTANEOUS | 99 refills | Status: DC

## 2016-06-04 NOTE — Telephone Encounter (Signed)
Needs refill on insulin. Insurance will not cover her lantus anymore.  She only took a third of a shot today. [facility] on [address]

## 2016-06-05 MED ORDER — INSULIN GLARGINE 100 UNIT/ML SOLOSTAR PEN: 20.0000 [IU] | PEN_INJECTOR | Freq: Every day | SUBCUTANEOUS | 99 refills | Status: DC

## 2016-06-05 MED ORDER — METFORMIN HCL 1000 MG PO TABS: ORAL_TABLET | ORAL | 1 refills | Status: DC

## 2016-06-05 MED ORDER — HYDROCHLOROTHIAZIDE 25 MG PO TABS: 25.0000 mg | ORAL_TABLET | Freq: Every day | ORAL | 3 refills | Status: DC

## 2016-06-05 MED ORDER — GLIPIZIDE 10 MG PO TABS: 10.0000 mg | ORAL_TABLET | Freq: Every day | ORAL | 1 refills | Status: DC

## 2016-06-09 ENCOUNTER — Other Ambulatory Visit: Payer: Self-pay | Admitting: Family Medicine

## 2016-12-17 ENCOUNTER — Other Ambulatory Visit: Payer: Self-pay | Admitting: Family Medicine

## 2017-01-14 DIAGNOSIS — H10413 Chronic giant papillary conjunctivitis, bilateral: Secondary | ICD-10-CM | POA: Diagnosis not present

## 2017-01-14 DIAGNOSIS — E119 Type 2 diabetes mellitus without complications: Secondary | ICD-10-CM | POA: Diagnosis not present

## 2017-01-14 DIAGNOSIS — H25813 Combined forms of age-related cataract, bilateral: Secondary | ICD-10-CM | POA: Diagnosis not present

## 2017-01-14 LAB — HM DIABETES EYE EXAM

## 2017-01-24 ENCOUNTER — Encounter: Payer: Self-pay | Admitting: Family Medicine

## 2017-01-28 ENCOUNTER — Telehealth: Payer: Self-pay | Admitting: Family Medicine

## 2017-01-28 NOTE — Telephone Encounter (Signed)
Left a message requesting pt to call back to speak about overdue HM items and possibly sched AWV - Mesha Guinyard

## 2017-02-03 ENCOUNTER — Other Ambulatory Visit: Payer: Self-pay | Admitting: Family Medicine

## 2017-02-18 ENCOUNTER — Telehealth: Payer: Self-pay

## 2017-02-18 NOTE — Telephone Encounter (Signed)
Pt contacted in reference to DM apt, June 6, with Dr. Erin Hearing. Pt did not answer. I left a detailed message reminding her of her 10am apt and to bring all of her medications to the visit.

## 2017-02-19 ENCOUNTER — Encounter: Payer: Self-pay | Admitting: Family Medicine

## 2017-02-19 ENCOUNTER — Encounter: Payer: Self-pay | Admitting: Licensed Clinical Social Worker

## 2017-02-19 ENCOUNTER — Ambulatory Visit (INDEPENDENT_AMBULATORY_CARE_PROVIDER_SITE_OTHER): Payer: BLUE CROSS/BLUE SHIELD | Admitting: Family Medicine

## 2017-02-19 VITALS — BP 124/76 | HR 64 | Temp 98.6°F | Ht 66.0 in | Wt 219.4 lb

## 2017-02-19 DIAGNOSIS — E119 Type 2 diabetes mellitus without complications: Secondary | ICD-10-CM | POA: Diagnosis not present

## 2017-02-19 DIAGNOSIS — I10 Essential (primary) hypertension: Secondary | ICD-10-CM | POA: Diagnosis not present

## 2017-02-19 LAB — POCT GLYCOSYLATED HEMOGLOBIN (HGB A1C): HEMOGLOBIN A1C: 7.8

## 2017-02-19 MED ORDER — ACYCLOVIR 800 MG PO TABS: 800.0000 mg | ORAL_TABLET | Freq: Two times a day (BID) | ORAL | 1 refills | Status: DC

## 2017-02-19 NOTE — Assessment & Plan Note (Signed)
BP Readings from Last 3 Encounters:  02/19/17 124/76  01/24/16 110/70  11/01/15 125/73   At goal.  Check labs

## 2017-02-19 NOTE — Patient Instructions (Addendum)
Good to see you today!  Thanks for coming in.  Homework   Cut back on Metformin to 100 mg twice a day   Increase Lantus 1 unit every morning your blood sugar is > 120   Schedule a colonoscopy and a mammogram   Work on weight loss - portion control   I will call you if your tests are not good.  Otherwise I will send you a letter.  If you do not hear from me with in 2 weeks please call our office.     Try clariten for your eyes.  If not working call me we may try a nasal steroid   Come back in August

## 2017-02-19 NOTE — Progress Notes (Signed)
Total time:15 minutes Type of Service: [place] Name and Language:NA  SUBJECTIVE: Theresa Miles is a 56 y.o. female  Patient was referred by Dr. Brooke Dare for: behavioral change with weight loss. Patient reports the following concerns: wanting to loose weight.  Has tried several things in the past about 5 years ago.   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  Intervention: Motivational Interviewing, Solution-Focused Strategies and Psychoeducation and/or [employer], as well as review of SMART goals, (provided education material on "Nutrition in the [employer]" and Living Well with Diabetes".  PLAN: 1. Patient will F/U with LCSW as needed  2. LCSW will F/U with patient via phone call in three weeks [phone] 3. Patient will exercise twice a week,  4. Eat more vegetables  5. Stop eating one unhealthy item that she is currently eating. 6. Patient will completed SMART goal work sheet   7. From scale of 1-10, how likely are you to follow plan: [place], LCSW Licensed Clinical Social Worker [place]   [phone] 11:35 AM

## 2017-02-19 NOTE — Progress Notes (Signed)
Subjective  Patient is presenting with the following illnesses  HYPERTENSION Disease Monitoring: Blood pressure range-Not checking Chest pain, palpitations- no      Dyspnea- no Medications: Compliance- daily Lightheadedness,Syncope- no   Edema- no  DIABETES Disease Monitoring: Blood Sugar ranges-ranges from 60-200s Has lows in early AM.  Using continuous monitor got from study Polyuria/phagia/dipsia- no      Visual problems- no Medications: Compliance- daily Hypoglycemic symptoms- see above    Monitoring Labs and Parameters Last A1C:  Lab Results  Component Value Date   HGBA1C 7.8 02/19/2017    Last Lipid:     Component Value Date/Time   CHOL 164 12/11/2011 0926   HDL 58 12/11/2011 0926    Last Bmet  Potassium  Date Value Ref Range Status  11/01/2015 4.2 3.5 - 5.3 mmol/L Final   Sodium  Date Value Ref Range Status  11/01/2015 142 135 - 146 mmol/L Final   Creat  Date Value Ref Range Status  11/01/2015 0.82 0.50 - 1.05 mg/dL Final      Last BPs:  BP Readings from Last 3 Encounters:  02/19/17 124/76  01/24/16 110/70  11/01/15 125/73        Chief Complaint noted Review of Symptoms - see HPI PMH - Smoking status noted.     Objective Vital Signs reviewed Diabetic Foot Check -  Appearance - no lesions, ulcers or calluses Skin - no unusual pallor or redness Monofilament testing -  Right - Great toe, medial, central, lateral ball and posterior foot intact Left - Great toe, medial, central, lateral ball and posterior foot intact Heart - Regular rate and rhythm.  No murmurs, gallops or rubs.    Lungs:  Normal respiratory effort, chest expands symmetrically. Lungs are clear to auscultation, no crackles or wheezes.     Assessments/Plans  No problem-specific Assessment & Plan notes found for this encounter.   See Encounter view if individual problem A/Ps not visible See after visit summary for details of patient instuctions

## 2017-02-19 NOTE — Assessment & Plan Note (Signed)
Improved but not at goal. At her suggestion will lower PM metformin and slowly increase lantus - see after visit summary

## 2017-02-20 ENCOUNTER — Encounter: Payer: Self-pay | Admitting: Family Medicine

## 2017-02-20 LAB — BASIC METABOLIC PANEL
BUN / CREAT RATIO: 20 (ref 9–23)
BUN: 17 mg/dL (ref 6–24)
CO2: 27 mmol/L (ref 18–29)
CREATININE: 0.87 mg/dL (ref 0.57–1.00)
Calcium: 9.3 mg/dL (ref 8.7–10.2)
Chloride: 104 mmol/L (ref 96–106)
GFR calc Af Amer: 86 mL/min/{1.73_m2} (ref >59)
GFR calc non Af Amer: 75 mL/min/{1.73_m2} (ref >59)
GLUCOSE: 114 mg/dL — AB (ref 65–99)
Potassium: 4.9 mmol/L (ref 3.5–5.2)
SODIUM: 143 mmol/L (ref 134–144)

## 2017-02-20 MED ORDER — ACYCLOVIR 5 % EX OINT: 1.0000 "application " | TOPICAL_OINTMENT | CUTANEOUS | 2 refills | Status: DC

## 2017-02-24 ENCOUNTER — Other Ambulatory Visit: Payer: Self-pay | Admitting: Family Medicine

## 2017-03-03 ENCOUNTER — Telehealth: Payer: Self-pay | Admitting: Family Medicine

## 2017-03-03 NOTE — Telephone Encounter (Signed)
Successful contact on mobile number.  DM: How is your DM medication regimen? Pt has cut back to Metformin 1000 mg BID. Her blood sugar has been low the past few times reading 60-65 so she hasn't has to increase her Lantus.  CA Screening: Have you scheduled a colonoscopy and mammogram? Not yet. Pt would like get some referrals for both and admits she hasn't check MyChart yet.  Weight loss: Have you portion controlled your meals?  Yes  Allergies: Has clariten helped your eyes? Yes along with OTC visine allergy eye drops  - Theresa Miles

## 2017-03-06 ENCOUNTER — Encounter: Payer: Self-pay | Admitting: Family Medicine

## 2017-03-06 DIAGNOSIS — Z1211 Encounter for screening for malignant neoplasm of colon: Secondary | ICD-10-CM

## 2017-03-06 NOTE — Progress Notes (Signed)
Subjective  Patient is presenting with the following illnesses     Chief Complaint noted Review of Symptoms - see HPI PMH - Smoking status noted.     Objective Vital Signs reviewed     Assessments/Plans  No problem-specific Assessment & Plan notes found for this encounter.   See Encounter view if individual problem A/Ps not visible See after visit summary for details of patient instuctions 

## 2017-03-12 ENCOUNTER — Telehealth: Payer: Self-pay | Admitting: Licensed Clinical Social Worker

## 2017-03-12 NOTE — Progress Notes (Signed)
[facility] f/u phone call to patient reference interventions discussed during joint visit with PCP.  LCSW left voice message.  Plan: LCSW will wait for return call if patient decides on further interventions or needs additional resources.  Casimer Lanius, LCSW Licensed Clinical Social Worker [place]   [phone] 9:45 AM

## 2017-05-08 ENCOUNTER — Encounter: Payer: Self-pay | Admitting: Family Medicine

## 2017-05-12 ENCOUNTER — Other Ambulatory Visit: Payer: Self-pay | Admitting: Family Medicine

## 2017-05-26 ENCOUNTER — Other Ambulatory Visit: Payer: Self-pay | Admitting: Family Medicine

## 2017-06-03 ENCOUNTER — Other Ambulatory Visit: Payer: Self-pay | Admitting: Family Medicine

## 2017-07-03 ENCOUNTER — Other Ambulatory Visit: Payer: Self-pay | Admitting: Family Medicine

## 2017-08-09 ENCOUNTER — Other Ambulatory Visit: Payer: Self-pay | Admitting: Family Medicine

## 2017-09-04 ENCOUNTER — Other Ambulatory Visit: Payer: Self-pay | Admitting: Family Medicine

## 2017-09-04 NOTE — Telephone Encounter (Signed)
Received request from [facility] for refill of metformin. Patient's last OV 02/19/2017. Will refill metformin for 30 days per protocol. Called patient and scheduled OV with PCP for 09/17/2017 at 4:10. Hubbard Hartshorn, RN, BSN

## 2017-09-17 ENCOUNTER — Other Ambulatory Visit: Payer: Self-pay

## 2017-09-17 ENCOUNTER — Encounter: Payer: Self-pay | Admitting: Family Medicine

## 2017-09-17 ENCOUNTER — Ambulatory Visit: Payer: BLUE CROSS/BLUE SHIELD | Admitting: Family Medicine

## 2017-09-17 VITALS — BP 128/82 | HR 65 | Temp 98.6°F | Wt 220.2 lb

## 2017-09-17 DIAGNOSIS — I10 Essential (primary) hypertension: Secondary | ICD-10-CM | POA: Diagnosis not present

## 2017-09-17 DIAGNOSIS — M25579 Pain in unspecified ankle and joints of unspecified foot: Secondary | ICD-10-CM

## 2017-09-17 DIAGNOSIS — E119 Type 2 diabetes mellitus without complications: Secondary | ICD-10-CM

## 2017-09-17 DIAGNOSIS — Z13220 Encounter for screening for lipoid disorders: Secondary | ICD-10-CM | POA: Diagnosis not present

## 2017-09-17 DIAGNOSIS — Z23 Encounter for immunization: Secondary | ICD-10-CM | POA: Diagnosis not present

## 2017-09-17 LAB — POCT GLYCOSYLATED HEMOGLOBIN (HGB A1C): Hemoglobin A1C: 9.3

## 2017-09-17 MED ORDER — EMPAGLIFLOZIN 10 MG PO TABS: 10.0000 mg | ORAL_TABLET | Freq: Every day | ORAL | 1 refills | Status: DC

## 2017-09-17 NOTE — Assessment & Plan Note (Signed)
Poorly controlled - see after visit summary

## 2017-09-17 NOTE — Assessment & Plan Note (Signed)
Bilateral sprain.  Very unlikely to be significant fracture since more than 3 weeks ago.   Will follow

## 2017-09-17 NOTE — Assessment & Plan Note (Signed)
At goal.  

## 2017-09-17 NOTE — Patient Instructions (Addendum)
Good to see you today!  Thanks for coming in.  Come back in 1 month  Increase your lantus by one unit every day your fasting blood sugar is more than 150  Start the [place] one daily - stop the [employer] all your medication bottles and your meter  If your ankles are not slowly improving then call and I will order an xray  Ask for a copy of your mammogram

## 2017-09-17 NOTE — Progress Notes (Signed)
Subjective  Patient is presenting with the following illnesses  HYPERTENSION Disease Monitoring: Blood pressure range-not checking Chest pain, palpitations- no      Dyspnea- no Medications: Compliance- hctz daily Lightheadedness,Syncope- no   Edema- no  DIABETES Disease Monitoring: Blood Sugar ranges-70s-120s - did not bring meter Polyuria/phagia/dipsia- no      Visual problems- o Medications: Compliance- feels she takes daily Hypoglycemic symptoms- no  ANKLE PAIN Sprained both ankles about 3 weeks ago coming down steps.  Has been walking on them but is painful and are swollen.  Diffusely tender no specific focal area   Monitoring Labs and Parameters Last A1C:  Lab Results  Component Value Date   HGBA1C 9.3 09/17/2017    Last Lipid:     Component Value Date/Time   CHOL 164 12/11/2011 0926   HDL 58 12/11/2011 0926    Last Bmet  Potassium  Date Value Ref Range Status  02/19/2017 4.9 3.5 - 5.2 mmol/L Final   Sodium  Date Value Ref Range Status  02/19/2017 143 134 - 144 mmol/L Final   Creat  Date Value Ref Range Status  11/01/2015 0.82 0.50 - 1.05 mg/dL Final   Creatinine, Ser  Date Value Ref Range Status  02/19/2017 0.87 0.57 - 1.00 mg/dL Final      Last BPs:  BP Readings from Last 3 Encounters:  09/17/17 128/82  02/19/17 124/76  01/24/16 110/70    Chief Complaint noted Review of Symptoms - see HPI PMH - Smoking status noted.     Objective Vital Signs reviewed Ankles - both swollen midly diffusely with tenderness medially and laterally mildly.  Walking with mild limp No calf tenderness  Assessments/Plans  Diabetes mellitus, stable Poorly controlled - see after visit summary    ESSENTIAL HYPERTENSION, BENIGN At goal   Pain in joint, ankle and foot Bilateral sprain.  Very unlikely to be significant fracture since more than 3 weeks ago.   Will follow    See after visit summary for details of patient instuctions

## 2017-09-18 LAB — LIPID PANEL
CHOL/HDL RATIO: 2.5 ratio (ref 0.0–4.4)
CHOLESTEROL TOTAL: 135 mg/dL (ref 100–199)
HDL: 54 mg/dL (ref >39)
LDL Calculated: 66 mg/dL (ref 0–99)
TRIGLYCERIDES: 75 mg/dL (ref 0–149)
VLDL Cholesterol Cal: 15 mg/dL (ref 5–40)

## 2017-09-18 LAB — VITAMIN B12: Vitamin B-12: 738 pg/mL (ref 232–1245)

## 2017-10-03 ENCOUNTER — Other Ambulatory Visit: Payer: Self-pay | Admitting: Family Medicine

## 2017-11-10 ENCOUNTER — Other Ambulatory Visit: Payer: Self-pay | Admitting: Family Medicine

## 2017-11-24 DIAGNOSIS — Z1231 Encounter for screening mammogram for malignant neoplasm of breast: Secondary | ICD-10-CM | POA: Diagnosis not present

## 2017-11-25 ENCOUNTER — Ambulatory Visit: Payer: BLUE CROSS/BLUE SHIELD | Admitting: Family Medicine

## 2017-11-25 ENCOUNTER — Other Ambulatory Visit: Payer: Self-pay

## 2017-11-25 ENCOUNTER — Encounter: Payer: Self-pay | Admitting: Family Medicine

## 2017-11-25 ENCOUNTER — Encounter: Payer: Self-pay | Admitting: Gastroenterology

## 2017-11-25 VITALS — BP 110/72 | HR 71 | Temp 98.3°F | Wt 218.4 lb

## 2017-11-25 DIAGNOSIS — E6609 Other obesity due to excess calories: Secondary | ICD-10-CM | POA: Diagnosis not present

## 2017-11-25 DIAGNOSIS — E119 Type 2 diabetes mellitus without complications: Secondary | ICD-10-CM

## 2017-11-25 DIAGNOSIS — I10 Essential (primary) hypertension: Secondary | ICD-10-CM | POA: Diagnosis not present

## 2017-11-25 DIAGNOSIS — Z1211 Encounter for screening for malignant neoplasm of colon: Secondary | ICD-10-CM

## 2017-11-25 LAB — POCT GLYCOSYLATED HEMOGLOBIN (HGB A1C): Hemoglobin A1C: 9.3

## 2017-11-25 NOTE — Assessment & Plan Note (Signed)
Well controlled 

## 2017-11-25 NOTE — Assessment & Plan Note (Signed)
Not controlled.  Would have thought her A1c would have been better by her blood sugar fasting readings.  Has not been a full 3 months since last.  Will recheck in one month

## 2017-11-25 NOTE — Assessment & Plan Note (Signed)
Slight improvement.  Hopefully restarting exercise will help

## 2017-11-25 NOTE — Telephone Encounter (Addendum)
Pt called nurse line, requesting refill of glipizide. [facility]. Wallace Cullens, RN

## 2017-11-25 NOTE — Progress Notes (Signed)
Subjective  Patient is presenting with the following illnesses  DIABETES Disease Monitoring: Blood Sugar ranges(Severity) -has been checking fastings every AM.  Readings range from 60s-upper 100s.  Average around 130  Associated Symptoms- Polyuria/phagia/dipsia- no      Visual problems- no Medications: Compliance(Modifying factor) - takes 25 u Lantus in AM around 730.  Takes oral medications at 1130 AM lunch and 11 PM at bedtime.  Tried empaglafloziin but had several yeast infections so stopped and went back to Glipizide 10 mg about a month ago Hypoglycemic symptoms- no Timing - continuous  HYPERTENSION Disease Monitoring  Home BP Monitoring (Severity) not checking Symptoms - Chest pain- no    Dyspnea- no Medications (Modifying factors) Compliance-  Daily hctz. Lightheadedness-  no  Edema- no Timing - continuous  Duration - years ROS - See HPI  OBESITY Current weight/BMI : Body mass index is 35.25 kg/m.    How long have been obese:  years Course:  Got down to 180s a few years ago with intense diet and exercise but has increased since Problems or symptoms it causes:  fatigue   Things have tried to improve:  Will restart exercise since ankles are better   Monitoring Labs and Parameters Last A1C:  Lab Results  Component Value Date   HGBA1C 9.3 11/25/2017   Last Lipid:     Component Value Date/Time   CHOL 135 09/17/2017 1701   HDL 54 09/17/2017 1701   Last Bmet  Potassium  Date Value Ref Range Status  02/19/2017 4.9 3.5 - 5.2 mmol/L Final   Sodium  Date Value Ref Range Status  02/19/2017 143 134 - 144 mmol/L Final   Creat  Date Value Ref Range Status  11/01/2015 0.82 0.50 - 1.05 mg/dL Final   Creatinine, Ser  Date Value Ref Range Status  02/19/2017 0.87 0.57 - 1.00 mg/dL Final      Chief Complaint noted Review of Symptoms - see HPI PMH - Smoking status noted.     Objective Vital Signs reviewed    Assessments/Plans  Diabetes mellitus, stable Not  controlled.  Would have thought her A1c would have been better by her blood sugar fasting readings.  Has not been a full 3 months since last.  Will recheck in one month  ESSENTIAL HYPERTENSION, BENIGN Well controlled   Obesity Slight improvement.  Hopefully restarting exercise will help    See after visit summary for details of patient instuctions

## 2017-11-25 NOTE — Patient Instructions (Addendum)
Good to see you today!  Thanks for coming in.  Will restart walking - 1 hour or 3 miles every other day - start slowly.  Any unusual shortness of breath or any chest pain call us  Will ask work to send [place] a copy of your Mammogram   Colonoscopy - I put in a referral  Come back in 1 month for another A1c

## 2017-11-26 MED ORDER — GLIPIZIDE 10 MG PO TABS: 10.0000 mg | ORAL_TABLET | Freq: Every day | ORAL | 1 refills | Status: DC

## 2017-12-05 ENCOUNTER — Encounter: Payer: Self-pay | Admitting: Family Medicine

## 2017-12-23 ENCOUNTER — Ambulatory Visit: Payer: BLUE CROSS/BLUE SHIELD | Admitting: Family Medicine

## 2017-12-23 ENCOUNTER — Encounter: Payer: Self-pay | Admitting: Family Medicine

## 2017-12-23 ENCOUNTER — Other Ambulatory Visit: Payer: Self-pay

## 2017-12-23 VITALS — BP 122/78 | HR 73 | Temp 98.4°F | Ht 66.0 in | Wt 217.8 lb

## 2017-12-23 DIAGNOSIS — E119 Type 2 diabetes mellitus without complications: Secondary | ICD-10-CM

## 2017-12-23 DIAGNOSIS — I10 Essential (primary) hypertension: Secondary | ICD-10-CM | POA: Diagnosis not present

## 2017-12-23 LAB — POCT GLYCOSYLATED HEMOGLOBIN (HGB A1C): HEMOGLOBIN A1C: 8.8

## 2017-12-23 MED ORDER — GLIPIZIDE 10 MG PO TABS: 10.0000 mg | ORAL_TABLET | Freq: Every day | ORAL | 2 refills | Status: DC

## 2017-12-23 MED ORDER — METFORMIN HCL 1000 MG PO TABS: ORAL_TABLET | ORAL | 3 refills | Status: DC

## 2017-12-23 NOTE — Assessment & Plan Note (Signed)
Improving continue current regimen

## 2017-12-23 NOTE — Assessment & Plan Note (Signed)
stable °

## 2017-12-23 NOTE — Progress Notes (Signed)
Subjective  Patient is presenting with the following illnesses  DIABETES Disease Monitoring: Blood Sugar ranges(Severity) -almost all below 120  Associated Symptoms- Polyuria/phagia/dipsia- no      Visual problems- no Medications: Compliance(Modifying factor) - metformin glipizide and lantus Hypoglycemic symptoms- no Timing - continuous  Monitoring Labs and Parameters Last A1C:  Lab Results  Component Value Date   HGBA1C 8.8 12/23/2017   Last Lipid:     Component Value Date/Time   CHOL 135 09/17/2017 1701   HDL 54 09/17/2017 1701   Last Bmet  Potassium  Date Value Ref Range Status  02/19/2017 4.9 3.5 - 5.2 mmol/L Final   Sodium  Date Value Ref Range Status  02/19/2017 143 134 - 144 mmol/L Final   Creat  Date Value Ref Range Status  11/01/2015 0.82 0.50 - 1.05 mg/dL Final   Creatinine, Ser  Date Value Ref Range Status  02/19/2017 0.87 0.57 - 1.00 mg/dL Final     HYPERTENSION Disease Monitoring  Home BP Monitoring (Severity) not checking Symptoms - Chest pain- no    Dyspnea- no Medications (Modifying factors) Compliance-  daily. Lightheadedness-  no  Edema- no Timing - continuous  Duration - years ROS - See HPI  PMH Lab Review   Potassium  Date Value Ref Range Status  02/19/2017 4.9 3.5 - 5.2 mmol/L Final   Sodium  Date Value Ref Range Status  02/19/2017 143 134 - 144 mmol/L Final   Creat  Date Value Ref Range Status  11/01/2015 0.82 0.50 - 1.05 mg/dL Final   Creatinine, Ser  Date Value Ref Range Status  02/19/2017 0.87 0.57 - 1.00 mg/dL Final        Chief Complaint noted Review of Symptoms - see HPI PMH - Smoking status noted.     Objective Vital Signs reviewed Psych:  Cognition and judgment appear intact. Alert, communicative  and cooperative with normal attention span and concentration. No apparent delusions, illusions, hallucinations     Assessments/Plans  Diabetes mellitus, stable Improving continue current regimen  ESSENTIAL  HYPERTENSION, BENIGN stable   See after visit summary for details of patient instuctions

## 2017-12-23 NOTE — Patient Instructions (Addendum)
Good to see you today!  Thanks for coming in.  Homework   Exercise - start slow work up to 3 miles three times a week   Call if your blood sugar change   Go to [place] for your birthday    Come back in 3 months for A1c check

## 2017-12-24 ENCOUNTER — Ambulatory Visit (AMBULATORY_SURGERY_CENTER): Payer: Self-pay

## 2017-12-24 VITALS — Ht 66.0 in | Wt 221.2 lb

## 2017-12-24 DIAGNOSIS — Z1211 Encounter for screening for malignant neoplasm of colon: Secondary | ICD-10-CM

## 2017-12-24 MED ORDER — NA SULFATE-K SULFATE-MG SULF 17.5-3.13-1.6 GM/177ML PO SOLN: 1.0000 | Freq: Once | ORAL | 0 refills | Status: AC

## 2017-12-24 NOTE — Progress Notes (Signed)
Per pt, no allergies to soy or egg products.Pt not taking any weight loss meds or using  O2 at home.  Pt refused emmi video. 

## 2017-12-25 ENCOUNTER — Encounter: Payer: Self-pay | Admitting: Gastroenterology

## 2018-01-07 ENCOUNTER — Encounter: Payer: Self-pay | Admitting: Gastroenterology

## 2018-01-07 ENCOUNTER — Other Ambulatory Visit: Payer: Self-pay

## 2018-01-07 ENCOUNTER — Ambulatory Visit (AMBULATORY_SURGERY_CENTER): Payer: BLUE CROSS/BLUE SHIELD | Admitting: Gastroenterology

## 2018-01-07 VITALS — BP 114/68 | HR 71 | Temp 96.8°F | Resp 17 | Ht 66.0 in | Wt 221.0 lb

## 2018-01-07 DIAGNOSIS — D129 Benign neoplasm of anus and anal canal: Secondary | ICD-10-CM

## 2018-01-07 DIAGNOSIS — D123 Benign neoplasm of transverse colon: Secondary | ICD-10-CM | POA: Diagnosis not present

## 2018-01-07 DIAGNOSIS — D125 Benign neoplasm of sigmoid colon: Secondary | ICD-10-CM

## 2018-01-07 DIAGNOSIS — D122 Benign neoplasm of ascending colon: Secondary | ICD-10-CM | POA: Diagnosis not present

## 2018-01-07 DIAGNOSIS — K635 Polyp of colon: Secondary | ICD-10-CM

## 2018-01-07 DIAGNOSIS — Z1211 Encounter for screening for malignant neoplasm of colon: Secondary | ICD-10-CM | POA: Diagnosis present

## 2018-01-07 DIAGNOSIS — D128 Benign neoplasm of rectum: Secondary | ICD-10-CM

## 2018-01-07 DIAGNOSIS — D127 Benign neoplasm of rectosigmoid junction: Secondary | ICD-10-CM | POA: Diagnosis not present

## 2018-01-07 MED ORDER — SODIUM CHLORIDE 0.9 % IV SOLN: 500.0000 mL | Freq: Once | INTRAVENOUS | Status: DC

## 2018-01-07 NOTE — Progress Notes (Signed)
Called to room to assist during endoscopic procedure.  Patient ID and intended procedure confirmed with present staff. Received instructions for my participation in the procedure from the performing physician.  

## 2018-01-07 NOTE — Progress Notes (Signed)
Pt's states no medical or surgical changes since previsit or office visit.  No egg or soy allergy  

## 2018-01-07 NOTE — Patient Instructions (Signed)
*  Handouts given to patient on polyps and hemorrhoids and a high fiber diet.  YOU HAD AN ENDOSCOPIC PROCEDURE TODAY AT [facility]:   Refer to the procedure report that was given to you for any specific questions about what was found during the examination.  If the procedure report does not answer your questions, please call your gastroenterologist to clarify.  If you requested that your care partner not be given the details of your procedure findings, then the procedure report has been included in a sealed envelope for you to review at your convenience later.  YOU SHOULD EXPECT: Some feelings of bloating in the abdomen. Passage of more gas than usual.  Walking can help get rid of the air that was put into your GI tract during the procedure and reduce the bloating. If you had a lower endoscopy (such as a colonoscopy or flexible sigmoidoscopy) you may notice spotting of blood in your stool or on the toilet paper. If you underwent a bowel prep for your procedure, you may not have a normal bowel movement for a few days.  Please Note:  You might notice some irritation and congestion in your nose or some drainage.  This is from the oxygen used during your procedure.  There is no need for concern and it should clear up in a day or so.  SYMPTOMS TO REPORT IMMEDIATELY:   Following lower endoscopy (colonoscopy or flexible sigmoidoscopy):  Excessive amounts of blood in the stool  Significant tenderness or worsening of abdominal pains  Swelling of the abdomen that is new, acute  Fever of 100F or higher   For urgent or emergent issues, a gastroenterologist can be reached at any hour by calling [phone].   DIET:  We do recommend a small meal at first, but then you may proceed to your regular diet.  Drink plenty of fluids but you should avoid alcoholic beverages for 24 hours.  ACTIVITY:  You should plan to take it easy for the rest of today and you should NOT DRIVE or use heavy  machinery until tomorrow (because of the sedation medicines used during the test).    FOLLOW UP: Our staff will call the number listed on your records the next business day following your procedure to check on you and address any questions or concerns that you may have regarding the information given to you following your procedure. If we do not reach you, we will leave a message.  However, if you are feeling well and you are not experiencing any problems, there is no need to return our call.  We will assume that you have returned to your regular daily activities without incident.  If any biopsies were taken you will be contacted by phone or by letter within the next 1-3 weeks.  Please call us at [phone] if you have not heard about the biopsies in 3 weeks.    SIGNATURES/CONFIDENTIALITY: You and/or your care partner have signed paperwork which will be entered into your electronic medical record.  These signatures attest to the fact that that the information above on your After Visit Summary has been reviewed and is understood.  Full responsibility of the confidentiality of this discharge information lies with you and/or your care-partner.

## 2018-01-07 NOTE — Progress Notes (Signed)
Report given to PACU, vss 

## 2018-01-07 NOTE — Op Note (Signed)
[place] Patient Name: Theresa Miles Procedure Date: 01/07/2018 8:48 AM MRN: [id number] Endoscopist: Mauri Pole , MD Age: 57 Referring MD:  Date of Birth: [birth year] Gender: Female Account #: [id number] Procedure:                Colonoscopy Indications:              Screening for colorectal malignant neoplasm Medicines:                Monitored Anesthesia Care Procedure:                Pre-Anesthesia Assessment:                           - Prior to the procedure, a History and Physical                            was performed, and patient medications and                            allergies were reviewed. The patient's tolerance of                            previous anesthesia was also reviewed. The risks                            and benefits of the procedure and the sedation                            options and risks were discussed with the patient.                            All questions were answered, and informed consent                            was obtained. Prior Anticoagulants: The patient has                            taken no previous anticoagulant or antiplatelet                            agents. ASA Grade Assessment: II - A patient with                            mild systemic disease. After reviewing the risks                            and benefits, the patient was deemed in                            satisfactory condition to undergo the procedure.                           After obtaining informed consent, the colonoscope  was passed under direct vision. Throughout the                            procedure, the patient's blood pressure, pulse, and                            oxygen saturations were monitored continuously. The                            Colonoscope was introduced through the anus and                            advanced to the the cecum, identified by                            appendiceal orifice  and ileocecal valve. The                            colonoscopy was performed without difficulty. The                            patient tolerated the procedure well. The quality                            of the bowel preparation was adequate. The                            ileocecal valve, appendiceal orifice, and rectum                            were photographed. Scope In: 8:56:52 AM Scope Out: 9:19:16 AM Scope Withdrawal Time: 0 hours 16 minutes 59 seconds  Total Procedure Duration: 0 hours 22 minutes 24 seconds  Findings:                 The perianal and digital rectal examinations were                            normal.                           Three sessile polyps were found in the rectum,                            sigmoid colon and transverse colon. The polyps were                            4 to 11 mm in size. These polyps were removed with                            a cold snare. Resection and retrieval were complete.                           A 1 mm polyp was found in the ascending colon. The  polyp was sessile. The polyp was removed with a                            cold biopsy forceps. Resection and retrieval were                            complete.                           Non-bleeding internal hemorrhoids were found during                            retroflexion. The hemorrhoids were small. Complications:            No immediate complications. Estimated Blood Loss:     Estimated blood loss was minimal. Impression:               - Three 4 to 11 mm polyps in the rectum, in the                            sigmoid colon and in the transverse colon, removed                            with a cold snare. Resected and retrieved.                           - One 1 mm polyp in the ascending colon, removed                            with a cold biopsy forceps. Resected and retrieved.                           - Non-bleeding internal  hemorrhoids. Recommendation:           - Patient has a contact number available for                            emergencies. The signs and symptoms of potential                            delayed complications were discussed with the                            patient. Return to normal activities tomorrow.                            Written discharge instructions were provided to the                            patient.                           - Resume previous diet.                           - Continue present  medications.                           - Await pathology results.                           - Repeat colonoscopy in 3 years for surveillance                            based on pathology results.                           - For future colonoscopy the patient will require                            an extended preparation. If there are any                            questions, please contact the gastroenterologist. Mauri Pole, MD 01/07/2018 9:22:54 AM This report has been signed electronically.

## 2018-01-08 ENCOUNTER — Telehealth: Payer: Self-pay | Admitting: *Deleted

## 2018-01-08 NOTE — Telephone Encounter (Signed)
  Follow up Call-  Call back number 01/07/2018  Post procedure Call Back phone  # [phone]  Permission to leave phone message Yes  Some recent data might be hidden     Patient questions:  Do you have a fever, pain , or abdominal swelling? No. Pain Score  0 *  Have you tolerated food without any problems? Yes.    Have you been able to return to your normal activities? Yes.    Do you have any questions about your discharge instructions: Diet   No. Medications  No. Follow up visit  No.  Do you have questions or concerns about your Care? No.  Actions: * If pain score is 4 or above: No action needed, pain <4.

## 2018-01-16 ENCOUNTER — Encounter: Payer: Self-pay | Admitting: Gastroenterology

## 2018-02-20 ENCOUNTER — Other Ambulatory Visit: Payer: Self-pay | Admitting: Family Medicine

## 2018-04-28 ENCOUNTER — Encounter: Payer: Self-pay | Admitting: Family Medicine

## 2018-04-28 ENCOUNTER — Other Ambulatory Visit: Payer: Self-pay

## 2018-04-28 ENCOUNTER — Ambulatory Visit: Payer: BLUE CROSS/BLUE SHIELD | Admitting: Family Medicine

## 2018-04-28 VITALS — BP 110/72 | HR 64 | Temp 98.8°F | Wt 219.4 lb

## 2018-04-28 DIAGNOSIS — I10 Essential (primary) hypertension: Secondary | ICD-10-CM

## 2018-04-28 DIAGNOSIS — E0865 Diabetes mellitus due to underlying condition with hyperglycemia: Secondary | ICD-10-CM

## 2018-04-28 DIAGNOSIS — E119 Type 2 diabetes mellitus without complications: Secondary | ICD-10-CM | POA: Diagnosis not present

## 2018-04-28 DIAGNOSIS — IMO0001 Reserved for inherently not codable concepts without codable children: Secondary | ICD-10-CM

## 2018-04-28 LAB — POCT GLYCOSYLATED HEMOGLOBIN (HGB A1C): HbA1c, POC (controlled diabetic range): 9.1 % — AB (ref 0.0–7.0)

## 2018-04-28 NOTE — Assessment & Plan Note (Signed)
At goal check labs

## 2018-04-28 NOTE — Progress Notes (Signed)
Subjective  Theresa Miles is a 57 y.o. female is presenting with the following  HYPERTENSION Disease Monitoring: Blood pressure range-not checking Chest pain, palpitations- no      Dyspnea- no Medications: Compliance- daily Lightheadedness,Syncope- no   Edema- no  DIABETES Disease Monitoring: Blood Sugar ranges-fasting are less than 150 she forgot her meter.  No real change in weight.  Doesn't have time to walk due to family issues Polyuria/phagia/dipsia- no      Visual problems- no Medications: Compliance- knows all medications and schedules and reports does not miss Hypoglycemic symptoms- no  Monitoring Labs and Parameters Last A1C:  Lab Results  Component Value Date   HGBA1C 9.1 (A) 04/28/2018    Last Lipid:     Component Value Date/Time   CHOL 135 09/17/2017 1701   HDL 54 09/17/2017 1701    Last Bmet  Potassium  Date Value Ref Range Status  02/19/2017 4.9 3.5 - 5.2 mmol/L Final   Sodium  Date Value Ref Range Status  02/19/2017 143 134 - 144 mmol/L Final   Creat  Date Value Ref Range Status  11/01/2015 0.82 0.50 - 1.05 mg/dL Final   Creatinine, Ser  Date Value Ref Range Status  02/19/2017 0.87 0.57 - 1.00 mg/dL Final      Last BPs:  BP Readings from Last 3 Encounters:  04/28/18 110/72  01/07/18 114/68  12/23/17 122/78    Chief Complaint noted Review of Symptoms - see HPI PMH - Smoking status noted.    Objective Vital Signs reviewed BP 110/72   Pulse 64   Temp 98.8 F (37.1 C) (Oral)   Wt 219 lb 6.4 oz (99.5 kg)   SpO2 99%   BMI 35.41 kg/m  Diabetic Foot Check -  Appearance - no lesions, ulcers or calluses Skin - no unusual pallor or redness Monofilament testing -  Right - Great toe, medial, central, lateral ball and posterior foot intact Left - Great toe, medial, central, lateral ball and posterior foot intact  Assessments/Plans  See after visit summary for details of patient instuctions  ESSENTIAL HYPERTENSION, BENIGN At goal check  labs   Diabetes mellitus due to underlying condition, uncontrolled, without complication ([place]) Not controlled.  See after visit summary for discussion of next medications to try.  She will let me know

## 2018-04-28 NOTE — Patient Instructions (Signed)
Good to see you today!  Thanks for coming in.  Medications to consider for your diabetes  - Liraglutide - Victoza -  GLP1  -Sitagliptin - Januvia  - DPP4  Let me know what you think or if you would like to meet with our pharmacist  Keep working on your weight aim to loose about 1 lb per week.  What did you do in the past   I will call you if your tests are not good.  Otherwise I will send you a letter.  If you do not hear from me with in 2 weeks please call our office.

## 2018-04-28 NOTE — Assessment & Plan Note (Signed)
Not controlled.  See after visit summary for discussion of next medications to try.  She will let me know

## 2018-04-29 ENCOUNTER — Encounter: Payer: Self-pay | Admitting: Family Medicine

## 2018-04-29 ENCOUNTER — Other Ambulatory Visit: Payer: Self-pay | Admitting: Family Medicine

## 2018-04-29 LAB — CMP14+EGFR
A/G RATIO: 1.3 (ref 1.2–2.2)
ALBUMIN: 4 g/dL (ref 3.5–5.5)
ALT: 10 IU/L (ref 0–32)
AST: 11 IU/L (ref 0–40)
Alkaline Phosphatase: 96 IU/L (ref 39–117)
BUN / CREAT RATIO: 16 (ref 9–23)
BUN: 13 mg/dL (ref 6–24)
Bilirubin Total: 0.3 mg/dL (ref 0.0–1.2)
CALCIUM: 9.5 mg/dL (ref 8.7–10.2)
CO2: 26 mmol/L (ref 20–29)
CREATININE: 0.83 mg/dL (ref 0.57–1.00)
Chloride: 104 mmol/L (ref 96–106)
GFR, EST AFRICAN AMERICAN: 91 mL/min/{1.73_m2} (ref >59)
GFR, EST NON AFRICAN AMERICAN: 79 mL/min/{1.73_m2} (ref >59)
GLOBULIN, TOTAL: 3 g/dL (ref 1.5–4.5)
Glucose: 130 mg/dL — ABNORMAL HIGH (ref 65–99)
Potassium: 4.7 mmol/L (ref 3.5–5.2)
SODIUM: 142 mmol/L (ref 134–144)
Total Protein: 7 g/dL (ref 6.0–8.5)

## 2018-04-30 MED ORDER — LIRAGLUTIDE 18 MG/3ML ~~LOC~~ SOPN: PEN_INJECTOR | SUBCUTANEOUS | 3 refills | Status: DC

## 2018-05-07 ENCOUNTER — Encounter: Payer: Self-pay | Admitting: Family Medicine

## 2018-05-14 ENCOUNTER — Encounter: Payer: Self-pay | Admitting: Family Medicine

## 2018-05-14 DIAGNOSIS — M25551 Pain in right hip: Secondary | ICD-10-CM | POA: Diagnosis not present

## 2018-05-14 DIAGNOSIS — M7061 Trochanteric bursitis, right hip: Secondary | ICD-10-CM | POA: Diagnosis not present

## 2018-05-29 ENCOUNTER — Other Ambulatory Visit: Payer: Self-pay | Admitting: Family Medicine

## 2018-05-29 MED ORDER — LIRAGLUTIDE 18 MG/3ML ~~LOC~~ SOPN: PEN_INJECTOR | SUBCUTANEOUS | 3 refills | Status: DC

## 2018-06-02 ENCOUNTER — Other Ambulatory Visit: Payer: Self-pay

## 2018-06-02 MED ORDER — LIRAGLUTIDE 18 MG/3ML ~~LOC~~ SOPN: PEN_INJECTOR | SUBCUTANEOUS | 3 refills | Status: DC

## 2018-06-04 ENCOUNTER — Encounter: Payer: Self-pay | Admitting: Family Medicine

## 2018-06-04 MED ORDER — LIRAGLUTIDE 18 MG/3ML ~~LOC~~ SOPN: PEN_INJECTOR | SUBCUTANEOUS | 3 refills | Status: DC

## 2018-06-10 DIAGNOSIS — M7521 Bicipital tendinitis, right shoulder: Secondary | ICD-10-CM | POA: Diagnosis not present

## 2018-06-28 ENCOUNTER — Other Ambulatory Visit: Payer: Self-pay | Admitting: Family Medicine

## 2018-08-28 ENCOUNTER — Other Ambulatory Visit: Payer: Self-pay | Admitting: Family Medicine

## 2018-11-20 ENCOUNTER — Other Ambulatory Visit: Payer: Self-pay | Admitting: Family Medicine

## 2018-11-30 ENCOUNTER — Other Ambulatory Visit: Payer: Self-pay | Admitting: Family Medicine

## 2018-11-30 DIAGNOSIS — E119 Type 2 diabetes mellitus without complications: Secondary | ICD-10-CM

## 2019-01-30 ENCOUNTER — Other Ambulatory Visit: Payer: Self-pay | Admitting: Family Medicine

## 2019-02-23 ENCOUNTER — Other Ambulatory Visit: Payer: Self-pay | Admitting: Family Medicine

## 2019-02-24 ENCOUNTER — Ambulatory Visit: Payer: BC Managed Care – PPO | Admitting: Family Medicine

## 2019-02-24 ENCOUNTER — Other Ambulatory Visit: Payer: Self-pay

## 2019-02-24 ENCOUNTER — Encounter: Payer: Self-pay | Admitting: Family Medicine

## 2019-02-24 VITALS — BP 105/75 | HR 75

## 2019-02-24 DIAGNOSIS — E119 Type 2 diabetes mellitus without complications: Secondary | ICD-10-CM

## 2019-02-24 DIAGNOSIS — I10 Essential (primary) hypertension: Secondary | ICD-10-CM

## 2019-02-24 DIAGNOSIS — E0865 Diabetes mellitus due to underlying condition with hyperglycemia: Secondary | ICD-10-CM

## 2019-02-24 DIAGNOSIS — IMO0001 Reserved for inherently not codable concepts without codable children: Secondary | ICD-10-CM

## 2019-02-24 LAB — POCT GLYCOSYLATED HEMOGLOBIN (HGB A1C): HbA1c, POC (controlled diabetic range): 7.2 % — AB (ref 0.0–7.0)

## 2019-02-24 MED ORDER — GLIPIZIDE 10 MG PO TABS: 10.0000 mg | ORAL_TABLET | Freq: Every day | ORAL | 1 refills | Status: DC

## 2019-02-24 MED ORDER — ACYCLOVIR 5 % EX OINT: 1.0000 "application " | TOPICAL_OINTMENT | CUTANEOUS | 2 refills | Status: AC

## 2019-02-24 MED ORDER — ATORVASTATIN CALCIUM 40 MG PO TABS: 40.0000 mg | ORAL_TABLET | Freq: Every day | ORAL | 3 refills | Status: DC

## 2019-02-24 NOTE — Assessment & Plan Note (Signed)
Much improved. Hopefully can maintain.  Is in a contest with her friends to have a good A1c.

## 2019-02-24 NOTE — Progress Notes (Signed)
a1c

## 2019-02-24 NOTE — Assessment & Plan Note (Signed)
Well controlled.  Will check labs

## 2019-02-24 NOTE — Patient Instructions (Addendum)
Good to see you today!  Thanks for coming in.  Keep doing what you are doing. Watch out when you go back to work  Think about regular exercise   I will call you if your tests are not good.  Otherwise I will send you a letter.  If you do not hear from me with in 2 weeks please call our office.     We will ask Dr Carolynn Sayers for your eye records

## 2019-02-24 NOTE — Progress Notes (Signed)
Subjective  Theresa Miles is a 58 y.o. female is presenting with the following  She feels well  HYPERTENSION Disease Monitoring: Blood pressure range-not checking Chest pain, palpitations- no      Dyspnea- no Medications: Compliance- daily HCTZ Lightheadedness,Syncope- no   Edema- no  DIABETES Disease Monitoring: Blood Sugar ranges-usually fasting less than 120s Polyuria/phagia/dipsia- no      Visual problems- no Medications: Compliance- knows all her medications and doses Hypoglycemic symptoms- no   Monitoring Labs and Parameters Last A1C:  Lab Results  Component Value Date   HGBA1C 7.2 (A) 02/24/2019    Last Lipid:     Component Value Date/Time   CHOL 135 09/17/2017 1701   HDL 54 09/17/2017 1701    Last Bmet  Potassium  Date Value Ref Range Status  04/28/2018 4.7 3.5 - 5.2 mmol/L Final   Sodium  Date Value Ref Range Status  04/28/2018 142 134 - 144 mmol/L Final   Creat  Date Value Ref Range Status  11/01/2015 0.82 0.50 - 1.05 mg/dL Final   Creatinine, Ser  Date Value Ref Range Status  04/28/2018 0.83 0.57 - 1.00 mg/dL Final      Last BPs:  BP Readings from Last 3 Encounters:  02/24/19 105/75  04/28/18 110/72  01/07/18 114/68       Chief Complaint noted Review of Symptoms - see HPI PMH - Smoking status noted.    Objective Vital Signs reviewed BP 105/75   Pulse 75   SpO2 100%  Psych:  Cognition and judgment appear intact. Alert, communicative  and cooperative with normal attention span and concentration. No apparent delusions, illusions, hallucinations  Assessments/Plans  See after visit summary for details of patient instuctions  Diabetes mellitus due to underlying condition, uncontrolled, without complication ([place]) Much improved. Hopefully can maintain.  Is in a contest with her friends to have a good A1c.    ESSENTIAL HYPERTENSION, BENIGN Well controlled.  Will check labs

## 2019-02-25 ENCOUNTER — Encounter: Payer: Self-pay | Admitting: Family Medicine

## 2019-02-25 LAB — CMP14+EGFR
ALT: 9 IU/L (ref 0–32)
AST: 11 IU/L (ref 0–40)
Albumin/Globulin Ratio: 1.6 (ref 1.2–2.2)
Albumin: 4.1 g/dL (ref 3.8–4.9)
Alkaline Phosphatase: 82 IU/L (ref 39–117)
BUN/Creatinine Ratio: 17 (ref 9–23)
BUN: 14 mg/dL (ref 6–24)
Bilirubin Total: <0.2 mg/dL (ref 0.0–1.2)
CO2: 21 mmol/L (ref 20–29)
Calcium: 9.3 mg/dL (ref 8.7–10.2)
Chloride: 103 mmol/L (ref 96–106)
Creatinine, Ser: 0.81 mg/dL (ref 0.57–1.00)
GFR calc Af Amer: 93 mL/min/{1.73_m2} (ref >59)
GFR calc non Af Amer: 80 mL/min/{1.73_m2} (ref >59)
Globulin, Total: 2.5 g/dL (ref 1.5–4.5)
Glucose: 142 mg/dL — ABNORMAL HIGH (ref 65–99)
Potassium: 4.2 mmol/L (ref 3.5–5.2)
Sodium: 141 mmol/L (ref 134–144)
Total Protein: 6.6 g/dL (ref 6.0–8.5)

## 2019-03-23 ENCOUNTER — Other Ambulatory Visit: Payer: Self-pay | Admitting: Family Medicine

## 2019-04-05 ENCOUNTER — Other Ambulatory Visit: Payer: Self-pay | Admitting: Family Medicine

## 2019-04-05 DIAGNOSIS — E119 Type 2 diabetes mellitus without complications: Secondary | ICD-10-CM

## 2019-05-19 ENCOUNTER — Other Ambulatory Visit: Payer: Self-pay | Admitting: Family Medicine

## 2019-06-21 ENCOUNTER — Other Ambulatory Visit: Payer: Self-pay | Admitting: Family Medicine

## 2019-07-19 ENCOUNTER — Ambulatory Visit: Payer: BC Managed Care – PPO | Admitting: Family Medicine

## 2019-07-19 ENCOUNTER — Encounter: Payer: Self-pay | Admitting: Family Medicine

## 2019-07-19 ENCOUNTER — Other Ambulatory Visit: Payer: Self-pay

## 2019-07-19 VITALS — BP 130/80 | HR 76 | Wt 202.0 lb

## 2019-07-19 DIAGNOSIS — E089 Diabetes mellitus due to underlying condition without complications: Secondary | ICD-10-CM

## 2019-07-19 DIAGNOSIS — E6609 Other obesity due to excess calories: Secondary | ICD-10-CM | POA: Diagnosis not present

## 2019-07-19 DIAGNOSIS — I10 Essential (primary) hypertension: Secondary | ICD-10-CM

## 2019-07-19 DIAGNOSIS — Z23 Encounter for immunization: Secondary | ICD-10-CM

## 2019-07-19 DIAGNOSIS — E1169 Type 2 diabetes mellitus with other specified complication: Secondary | ICD-10-CM | POA: Insufficient documentation

## 2019-07-19 DIAGNOSIS — Z87891 Personal history of nicotine dependence: Secondary | ICD-10-CM

## 2019-07-19 DIAGNOSIS — E785 Hyperlipidemia, unspecified: Secondary | ICD-10-CM

## 2019-07-19 DIAGNOSIS — Z794 Long term (current) use of insulin: Secondary | ICD-10-CM

## 2019-07-19 DIAGNOSIS — D869 Sarcoidosis, unspecified: Secondary | ICD-10-CM

## 2019-07-19 LAB — POCT GLYCOSYLATED HEMOGLOBIN (HGB A1C): HbA1c, POC (controlled diabetic range): 7.6 % — AB (ref 0.0–7.0)

## 2019-07-19 NOTE — Progress Notes (Signed)
Subjective  Theresa Miles is a 58 y.o. female is presenting with the following  She feels well.  Has started her early retirement  HYPERTENSION Disease Monitoring: Blood pressure range-not checking Chest pain, palpitations- no      Dyspnea- no Medications: Compliance- daily hctz Lightheadedness,Syncope- no   Edema- no  DIABETES Disease Monitoring: Blood Sugar ranges-mid 100s to 60s Polyuria/phagia/dipsia- no      Visual problems- no Medications: Compliance- knows all her meds Hypoglycemic symptoms- no  HYPERLIPIDEMIA Disease Monitoring: See symptoms for Hypertension Medications: Compliance- daily atorvastatin Right upper quadrant pain- no  Muscle aches- no   Chief Complaint noted Review of Symptoms - see HPI PMH - Smoking status noted.    Objective Vital Signs reviewed BP 130/80   Pulse 76   Wt 202 lb (91.6 kg)   SpO2 99%   BMI 32.60 kg/m   Assessments/Plans  Diabetes mellitus due to underlying condition, controlled ([place]) Stable encouraged to stick to her exercise and diet  ESSENTIAL HYPERTENSION, BENIGN BP Readings from Last 3 Encounters:  07/19/19 130/80  02/24/19 105/75  04/28/18 110/72   Well controlled   Hyperlipidemia associated with type 2 diabetes mellitus ([place]) Controlled on statin   SARCOIDOSIS No symptoms   TOBACCO USE, QUIT Remains smoke free    See after visit summary for details of patient instructions

## 2019-07-19 NOTE — Patient Instructions (Addendum)
Good to see you today!  Thanks for coming in.  Your A1c is 7.6  See you eye doctor - ask them to send me report  Let me know which medications are covered on your insurance to replace Victoza  Continue doing what you are!  Enjoy retirement  Come back in 3-6 months

## 2019-07-19 NOTE — Assessment & Plan Note (Signed)
Controlled on statin

## 2019-07-19 NOTE — Assessment & Plan Note (Addendum)
" >>  ASSESSMENT AND PLAN FOR TYPE 2 DIABETES MELLITUS WITH HYPERCHOLESTEROLEMIA (HCC) WRITTEN ON 07/19/2019  2:20 PM BY Chantrice Hagg L, MD  Stable encouraged to stick to her exercise and diet   >>ASSESSMENT AND PLAN FOR HYPERLIPIDEMIA ASSOCIATED WITH TYPE 2 DIABETES MELLITUS (HCC) WRITTEN ON 07/19/2019  2:54 PM BY General Wearing L, MD  Controlled on statin  "

## 2019-07-19 NOTE — Assessment & Plan Note (Signed)
BP Readings from Last 3 Encounters:  07/19/19 130/80  02/24/19 105/75  04/28/18 110/72   Well controlled

## 2019-07-19 NOTE — Assessment & Plan Note (Signed)
Remains smoke free

## 2019-07-19 NOTE — Assessment & Plan Note (Signed)
No symptoms 

## 2019-07-21 MED ORDER — RYBELSUS 3 MG PO TABS: 3.0000 mg | ORAL_TABLET | Freq: Every day | ORAL | 0 refills | Status: DC

## 2019-08-04 ENCOUNTER — Other Ambulatory Visit: Payer: Self-pay | Admitting: Family Medicine

## 2019-08-05 DIAGNOSIS — E119 Type 2 diabetes mellitus without complications: Secondary | ICD-10-CM | POA: Diagnosis not present

## 2019-08-05 DIAGNOSIS — H1045 Other chronic allergic conjunctivitis: Secondary | ICD-10-CM | POA: Diagnosis not present

## 2019-08-05 DIAGNOSIS — H25813 Combined forms of age-related cataract, bilateral: Secondary | ICD-10-CM | POA: Diagnosis not present

## 2019-08-05 LAB — HM DIABETES EYE EXAM

## 2019-08-09 ENCOUNTER — Encounter: Payer: Self-pay | Admitting: Family Medicine

## 2019-08-11 ENCOUNTER — Other Ambulatory Visit: Payer: Self-pay | Admitting: *Deleted

## 2019-08-11 MED ORDER — RYBELSUS 3 MG PO TABS: 3.0000 mg | ORAL_TABLET | Freq: Every day | ORAL | 0 refills | Status: DC

## 2019-08-16 ENCOUNTER — Other Ambulatory Visit: Payer: Self-pay | Admitting: *Deleted

## 2019-08-16 MED ORDER — RYBELSUS 3 MG PO TABS: 3.0000 mg | ORAL_TABLET | Freq: Every day | ORAL | 0 refills | Status: DC

## 2019-09-01 ENCOUNTER — Encounter: Payer: Self-pay | Admitting: Family Medicine

## 2019-09-13 ENCOUNTER — Other Ambulatory Visit: Payer: Self-pay

## 2019-09-13 MED ORDER — LANTUS SOLOSTAR 100 UNIT/ML ~~LOC~~ SOPN: 24.0000 [IU] | PEN_INJECTOR | Freq: Every day | SUBCUTANEOUS | 4 refills | Status: DC

## 2019-10-08 ENCOUNTER — Encounter: Payer: Self-pay | Admitting: Family Medicine

## 2019-10-11 MED ORDER — ACCU-CHEK AVIVA PLUS VI STRP: ORAL_STRIP | 4 refills | Status: DC

## 2019-12-08 ENCOUNTER — Encounter: Payer: Self-pay | Admitting: Family Medicine

## 2019-12-08 ENCOUNTER — Other Ambulatory Visit: Payer: Self-pay

## 2019-12-08 MED ORDER — GLIPIZIDE 10 MG PO TABS: 10.0000 mg | ORAL_TABLET | Freq: Every day | ORAL | 0 refills | Status: DC

## 2019-12-24 ENCOUNTER — Other Ambulatory Visit: Payer: Self-pay | Admitting: Family Medicine

## 2019-12-24 ENCOUNTER — Encounter: Payer: Self-pay | Admitting: Family Medicine

## 2019-12-24 MED ORDER — GLIPIZIDE 10 MG PO TABS: 10.0000 mg | ORAL_TABLET | Freq: Every day | ORAL | 0 refills | Status: DC

## 2019-12-28 ENCOUNTER — Encounter: Payer: Self-pay | Admitting: Family Medicine

## 2019-12-28 ENCOUNTER — Other Ambulatory Visit: Payer: Self-pay

## 2019-12-28 NOTE — Telephone Encounter (Signed)
Patient sent in rx request via my chart for the following medications.  -Lipitor -Lantus -Victoza  Victoza is not on patient's current med list.   To PCP  Please advise  Talbot Grumbling, RN

## 2019-12-29 ENCOUNTER — Encounter: Payer: Self-pay | Admitting: Family Medicine

## 2019-12-29 MED ORDER — ATORVASTATIN CALCIUM 40 MG PO TABS: 40.0000 mg | ORAL_TABLET | Freq: Every day | ORAL | 3 refills | Status: DC

## 2019-12-29 MED ORDER — LANTUS SOLOSTAR 100 UNIT/ML ~~LOC~~ SOPN: 24.0000 [IU] | PEN_INJECTOR | Freq: Every day | SUBCUTANEOUS | 0 refills | Status: DC

## 2019-12-31 MED ORDER — LIRAGLUTIDE 18 MG/3ML ~~LOC~~ SOPN: PEN_INJECTOR | SUBCUTANEOUS | 3 refills | Status: DC

## 2020-01-11 ENCOUNTER — Encounter: Payer: Self-pay | Admitting: Family Medicine

## 2020-01-11 ENCOUNTER — Other Ambulatory Visit: Payer: Self-pay

## 2020-01-11 ENCOUNTER — Ambulatory Visit (INDEPENDENT_AMBULATORY_CARE_PROVIDER_SITE_OTHER): Payer: 59 | Admitting: Family Medicine

## 2020-01-11 VITALS — BP 118/72 | HR 73 | Ht 66.0 in | Wt 196.6 lb

## 2020-01-11 DIAGNOSIS — Z794 Long term (current) use of insulin: Secondary | ICD-10-CM | POA: Diagnosis not present

## 2020-01-11 DIAGNOSIS — E785 Hyperlipidemia, unspecified: Secondary | ICD-10-CM

## 2020-01-11 DIAGNOSIS — E1169 Type 2 diabetes mellitus with other specified complication: Secondary | ICD-10-CM | POA: Diagnosis not present

## 2020-01-11 DIAGNOSIS — E089 Diabetes mellitus due to underlying condition without complications: Secondary | ICD-10-CM

## 2020-01-11 DIAGNOSIS — I10 Essential (primary) hypertension: Secondary | ICD-10-CM | POA: Diagnosis not present

## 2020-01-11 LAB — POCT GLYCOSYLATED HEMOGLOBIN (HGB A1C): HbA1c, POC (controlled diabetic range): 8.4 % — AB (ref 0.0–7.0)

## 2020-01-11 NOTE — Progress Notes (Signed)
    SUBJECTIVE:   CHIEF COMPLAINT / HPI:   HYPERTENSION Blood pressure range-usually good Chest pain, palpitations- no       Medications Adherence- knows medications doses Lightheadedness,Syncope- no   Edema- no  DIABETES Blood Sugar ranges-fasting usually below 100   Medication Adherence admits to missing somedoses Hypoglycemic symptoms- no  HYPERLIPIDEMIA Medication Adherence reports taking daily Right upper quadrant pain- no  Severe Muscle aches- no   PERTINENT  PMH / PSH: remote sarcoid  OBJECTIVE:   BP 118/72   Pulse 73   Ht 5\' 6"  (1.676 m)   Wt 196 lb 9.6 oz (89.2 kg)   SpO2 98%   BMI 31.73 kg/m   Psych:  Cognition and judgment appear intact. Alert, communicative  and cooperative with normal attention span and concentration. No apparent delusions, illusions, hallucinations   ASSESSMENT/PLAN:   Diabetes mellitus due to underlying condition, controlled ([place]) Not at goal.  She feels can work on taking medications better.  Congratulated on weight loss  ESSENTIAL HYPERTENSION, BENIGN BP Readings from Last 3 Encounters:  01/11/20 118/72  07/19/19 130/80  02/24/19 105/75   At goal  Hyperlipidemia associated with type 2 diabetes mellitus ([place]) Taking medications      Lind Covert, MD [place]

## 2020-01-11 NOTE — Assessment & Plan Note (Addendum)
" >>  ASSESSMENT AND PLAN FOR TYPE 2 DIABETES MELLITUS WITH HYPERCHOLESTEROLEMIA (HCC) WRITTEN ON 01/11/2020 10:45 AM BY Cataleah Stites L, MD  Not at goal.  She feels can work on taking medications better.  Congratulated on weight loss   >>ASSESSMENT AND PLAN FOR HYPERLIPIDEMIA ASSOCIATED WITH TYPE 2 DIABETES MELLITUS (HCC) WRITTEN ON 01/11/2020 10:45 AM BY Yonatan Guitron L, MD  Taking medications  "

## 2020-01-11 NOTE — Assessment & Plan Note (Signed)
Taking medications

## 2020-01-11 NOTE — Patient Instructions (Signed)
Good to see you today!  Thanks for coming in.  Your diabetes could be better - ball is in your court - if A1c is not below 8 in 3 months may need to increase medications  You are due for a Mammogram  Increase exercise  I will call you if your tests are not good.  Otherwise I will send you a letter.  If you do not hear from me with in 2 weeks please call our office.     Come back the end of July

## 2020-01-11 NOTE — Assessment & Plan Note (Signed)
BP Readings from Last 3 Encounters:  01/11/20 118/72  07/19/19 130/80  02/24/19 105/75   At goal

## 2020-01-12 ENCOUNTER — Encounter: Payer: Self-pay | Admitting: Family Medicine

## 2020-01-12 LAB — BASIC METABOLIC PANEL
BUN/Creatinine Ratio: 16 (ref 9–23)
BUN: 12 mg/dL (ref 6–24)
CO2: 27 mmol/L (ref 20–29)
Calcium: 9 mg/dL (ref 8.7–10.2)
Chloride: 102 mmol/L (ref 96–106)
Creatinine, Ser: 0.74 mg/dL (ref 0.57–1.00)
GFR calc Af Amer: 103 mL/min/{1.73_m2} (ref >59)
GFR calc non Af Amer: 90 mL/min/{1.73_m2} (ref >59)
Glucose: 56 mg/dL — ABNORMAL LOW (ref 65–99)
Potassium: 3.9 mmol/L (ref 3.5–5.2)
Sodium: 140 mmol/L (ref 134–144)

## 2020-01-12 LAB — LIPID PANEL
Chol/HDL Ratio: 2 ratio (ref 0.0–4.4)
Cholesterol, Total: 96 mg/dL — ABNORMAL LOW (ref 100–199)
HDL: 48 mg/dL (ref >39)
LDL Chol Calc (NIH): 35 mg/dL (ref 0–99)
Triglycerides: 53 mg/dL (ref 0–149)
VLDL Cholesterol Cal: 13 mg/dL (ref 5–40)

## 2020-01-30 ENCOUNTER — Encounter: Payer: Self-pay | Admitting: Family Medicine

## 2020-01-31 ENCOUNTER — Other Ambulatory Visit: Payer: Self-pay

## 2020-01-31 DIAGNOSIS — E119 Type 2 diabetes mellitus without complications: Secondary | ICD-10-CM

## 2020-01-31 MED ORDER — METFORMIN HCL 1000 MG PO TABS: ORAL_TABLET | ORAL | 3 refills | Status: DC

## 2020-01-31 NOTE — Telephone Encounter (Signed)
Refill request sent to PCP.   Talbot Grumbling, RN

## 2020-02-22 ENCOUNTER — Other Ambulatory Visit: Payer: Self-pay

## 2020-02-23 MED ORDER — GLIPIZIDE 10 MG PO TABS: 10.0000 mg | ORAL_TABLET | Freq: Every day | ORAL | 0 refills | Status: DC

## 2020-03-13 ENCOUNTER — Encounter: Payer: Self-pay | Admitting: Family Medicine

## 2020-03-13 MED ORDER — HYDROCHLOROTHIAZIDE 25 MG PO TABS: 25.0000 mg | ORAL_TABLET | Freq: Every day | ORAL | 3 refills | Status: DC

## 2020-03-23 ENCOUNTER — Encounter: Payer: Self-pay | Admitting: Family Medicine

## 2020-03-23 ENCOUNTER — Other Ambulatory Visit: Payer: Self-pay

## 2020-03-23 MED ORDER — LANTUS SOLOSTAR 100 UNIT/ML ~~LOC~~ SOPN: 24.0000 [IU] | PEN_INJECTOR | Freq: Every day | SUBCUTANEOUS | 0 refills | Status: DC

## 2020-04-11 ENCOUNTER — Other Ambulatory Visit: Payer: Self-pay | Admitting: Family Medicine

## 2020-05-29 ENCOUNTER — Other Ambulatory Visit: Payer: Self-pay | Admitting: Family Medicine

## 2020-07-17 NOTE — Patient Instructions (Addendum)
Good to see you today!  Thanks for coming in.  You should exercise at least 20 minutes every day.    Choose something you like the most or hate the least.   Having a set time every day and having a partner will help you stick to it.    If you are too tired try to do at least 5 minutes, it often gets easier.  You need a mammogram to prevent breast cancer.  Please schedule an appointment.  You can call [phone].    Come back in 3 -4 months for diabetes and blood check   I have ordered the sleep study if you do not hear from them for scheduling then My Chart me   Keep taking every thing the way your are

## 2020-07-18 ENCOUNTER — Other Ambulatory Visit: Payer: Self-pay

## 2020-07-18 ENCOUNTER — Ambulatory Visit (INDEPENDENT_AMBULATORY_CARE_PROVIDER_SITE_OTHER): Payer: 59 | Admitting: Family Medicine

## 2020-07-18 VITALS — BP 126/70 | HR 71 | Ht 66.0 in | Wt 199.4 lb

## 2020-07-18 DIAGNOSIS — I10 Essential (primary) hypertension: Secondary | ICD-10-CM

## 2020-07-18 DIAGNOSIS — E1169 Type 2 diabetes mellitus with other specified complication: Secondary | ICD-10-CM | POA: Diagnosis not present

## 2020-07-18 DIAGNOSIS — E785 Hyperlipidemia, unspecified: Secondary | ICD-10-CM

## 2020-07-18 DIAGNOSIS — Z23 Encounter for immunization: Secondary | ICD-10-CM | POA: Diagnosis not present

## 2020-07-18 DIAGNOSIS — Z794 Long term (current) use of insulin: Secondary | ICD-10-CM | POA: Diagnosis not present

## 2020-07-18 DIAGNOSIS — R0683 Snoring: Secondary | ICD-10-CM

## 2020-07-18 DIAGNOSIS — E089 Diabetes mellitus due to underlying condition without complications: Secondary | ICD-10-CM | POA: Diagnosis not present

## 2020-07-18 LAB — POCT GLYCOSYLATED HEMOGLOBIN (HGB A1C): Hemoglobin A1C: 7.1 % — AB (ref 4.0–5.6)

## 2020-07-18 NOTE — Assessment & Plan Note (Signed)
BP Readings from Last 3 Encounters:  07/18/20 126/70  01/11/20 118/72  07/19/19 130/80   At goal

## 2020-07-18 NOTE — Assessment & Plan Note (Signed)
With daytime sleepiness  Refer for sleep study

## 2020-07-18 NOTE — Assessment & Plan Note (Addendum)
" >>  ASSESSMENT AND PLAN FOR TYPE 2 DIABETES MELLITUS WITH HYPERCHOLESTEROLEMIA (HCC) WRITTEN ON 07/18/2020 11:19 AM BY Sanyah Molnar L, MD  Improving.  Continue current medications    >>ASSESSMENT AND PLAN FOR HYPERLIPIDEMIA ASSOCIATED WITH TYPE 2 DIABETES MELLITUS (HCC) WRITTEN ON 07/18/2020 11:20 AM BY Tion Tse L, MD  Stable Check labs continue statin  "

## 2020-07-18 NOTE — Assessment & Plan Note (Signed)
Stable Check labs continue statin

## 2020-07-18 NOTE — Progress Notes (Signed)
    SUBJECTIVE:   CHIEF COMPLAINT / HPI:   HYPERTENSION Blood pressure range-not checking Chest pain, palpitations- no       Medications Adherence- daily hxtz Lightheadedness,Syncope- no   Edema- no  DIABETES Blood Sugar ranges-usually in 100s  Medication Adherence good Hypoglycemic symptoms- no  HYPERLIPIDEMIA Medication Adherence daily lipitor Right upper quadrant pain- no  Severe Muscle aches- no  SNORING Snores very loud getting worse.  No definite apnea noted.  Easily falls asleep when sitting during the day.  Sister has sleep apnea   PERTINENT  PMH / PSH: Working her own [employer]  OBJECTIVE:   BP 126/70   Pulse 71   Ht 5\' 6"  (1.676 m)   Wt 199 lb 6.4 oz (90.4 kg)   SpO2 100%   BMI 32.18 kg/m   Heart - Regular rate and rhythm.  No murmurs, gallops or rubs.    Lungs:  Normal respiratory effort, chest expands symmetrically. Lungs are clear to auscultation, no crackles or wheezes. Extremities:  No cyanosis, edema, or deformity noted with good range of motion of all major joints.     ASSESSMENT/PLAN:   ESSENTIAL HYPERTENSION, BENIGN BP Readings from Last 3 Encounters:  07/18/20 126/70  01/11/20 118/72  07/19/19 130/80   At goal  Diabetes mellitus due to underlying condition, controlled ([place]) Improving.  Continue current medications   Hyperlipidemia associated with type 2 diabetes mellitus ([place]) Stable Check labs continue statin   Snoring With daytime sleepiness  Refer for sleep study      Lind Covert, [place]

## 2020-08-07 ENCOUNTER — Encounter: Payer: Self-pay | Admitting: Family Medicine

## 2020-08-07 MED ORDER — VICTOZA 18 MG/3ML ~~LOC~~ SOPN
PEN_INJECTOR | SUBCUTANEOUS | 6 refills | Status: DC
Start: 2020-08-07 — End: 2021-03-28

## 2020-09-19 ENCOUNTER — Other Ambulatory Visit: Payer: Self-pay

## 2020-09-20 MED ORDER — GLIPIZIDE 10 MG PO TABS
10.0000 mg | ORAL_TABLET | Freq: Every day | ORAL | 1 refills | Status: DC
Start: 2020-09-20 — End: 2021-01-10

## 2020-11-11 ENCOUNTER — Other Ambulatory Visit: Payer: Self-pay | Admitting: Family Medicine

## 2020-12-08 ENCOUNTER — Encounter: Payer: Self-pay | Admitting: Family Medicine

## 2020-12-08 ENCOUNTER — Other Ambulatory Visit: Payer: Self-pay | Admitting: Family Medicine

## 2020-12-21 ENCOUNTER — Other Ambulatory Visit: Payer: Self-pay | Admitting: Family Medicine

## 2020-12-21 DIAGNOSIS — E119 Type 2 diabetes mellitus without complications: Secondary | ICD-10-CM

## 2021-01-09 ENCOUNTER — Other Ambulatory Visit: Payer: Self-pay | Admitting: Family Medicine

## 2021-03-07 ENCOUNTER — Other Ambulatory Visit: Payer: Self-pay | Admitting: Family Medicine

## 2021-03-20 ENCOUNTER — Other Ambulatory Visit: Payer: Self-pay

## 2021-03-21 MED ORDER — ATORVASTATIN CALCIUM 40 MG PO TABS
ORAL_TABLET | ORAL | 0 refills | Status: DC
Start: 2021-03-21 — End: 2021-04-30

## 2021-03-28 ENCOUNTER — Other Ambulatory Visit: Payer: Self-pay | Admitting: Family Medicine

## 2021-03-28 ENCOUNTER — Encounter: Payer: Self-pay | Admitting: Family Medicine

## 2021-04-24 ENCOUNTER — Encounter: Payer: Self-pay | Admitting: Family Medicine

## 2021-04-24 ENCOUNTER — Ambulatory Visit (INDEPENDENT_AMBULATORY_CARE_PROVIDER_SITE_OTHER): Payer: 59

## 2021-04-24 ENCOUNTER — Ambulatory Visit (INDEPENDENT_AMBULATORY_CARE_PROVIDER_SITE_OTHER): Payer: 59 | Admitting: Family Medicine

## 2021-04-24 ENCOUNTER — Other Ambulatory Visit: Payer: Self-pay

## 2021-04-24 ENCOUNTER — Other Ambulatory Visit: Payer: Self-pay | Admitting: *Deleted

## 2021-04-24 VITALS — BP 118/75 | HR 75 | Ht 66.0 in | Wt 192.2 lb

## 2021-04-24 DIAGNOSIS — I10 Essential (primary) hypertension: Secondary | ICD-10-CM

## 2021-04-24 DIAGNOSIS — E1169 Type 2 diabetes mellitus with other specified complication: Secondary | ICD-10-CM

## 2021-04-24 DIAGNOSIS — E785 Hyperlipidemia, unspecified: Secondary | ICD-10-CM

## 2021-04-24 DIAGNOSIS — Z794 Long term (current) use of insulin: Secondary | ICD-10-CM | POA: Diagnosis not present

## 2021-04-24 DIAGNOSIS — E089 Diabetes mellitus due to underlying condition without complications: Secondary | ICD-10-CM

## 2021-04-24 DIAGNOSIS — Z23 Encounter for immunization: Secondary | ICD-10-CM | POA: Diagnosis not present

## 2021-04-24 LAB — POCT GLYCOSYLATED HEMOGLOBIN (HGB A1C): HbA1c, POC (controlled diabetic range): 9.1 % — AB (ref 0.0–7.0)

## 2021-04-24 MED ORDER — VICTOZA 18 MG/3ML ~~LOC~~ SOPN: PEN_INJECTOR | SUBCUTANEOUS | 2 refills | Status: DC

## 2021-04-24 NOTE — Assessment & Plan Note (Signed)
BP Readings from Last 3 Encounters:  04/24/21 118/75  07/18/20 126/70  01/11/20 118/72   At goal continue current medications

## 2021-04-24 NOTE — Assessment & Plan Note (Signed)
Worsened.  Fortunately she continues to lose weight.  She will work on taking her medications regularly and will recheck in 6 months.  Could perhaps increase GLP1 but did have GI effects

## 2021-04-24 NOTE — Patient Instructions (Addendum)
Good to see you today - Thank you for coming in  Things we discussed today:  You need an diabetes eye exam every year.  Please see your eye doctor.  Ask them to fax [place] a report of your exam   You need a mammogram to prevent breast cancer.  Please schedule an appointment.  You can call [phone].     Think about the Shingles Vaccine - let me know if you want me to send to your pharmacy   Please always bring your medication bottles  Come back to see me in 3 month

## 2021-04-24 NOTE — Progress Notes (Signed)
    SUBJECTIVE:   CHIEF COMPLAINT / HPI:   Diabetes Has been taking some of her medications late .  Other wise knows her medications and dosages.  Taking 22 units Lantus daily.  Has tried higher victoza that gave her GI symptoms  Hypertension Daily HCTZ.  No lightheadness or edema   PERTINENT  PMH / PSH: works [employer] very busy during the holidays   OBJECTIVE:   BP 118/75   Pulse 75   Ht '5\' 6"'$  (1.676 m)   Wt 192 lb 3.2 oz (87.2 kg)   SpO2 98%   BMI 31.02 kg/m   Heart - Regular rate and rhythm.  No murmurs, gallops or rubs.    Lungs:  Normal respiratory effort, chest expands symmetrically. Lungs are clear to auscultation, no crackles or wheezes. Extremities:  No cyanosis, edema, or deformity noted with good range of motion of all major joints.     ASSESSMENT/PLAN:   ESSENTIAL HYPERTENSION, BENIGN BP Readings from Last 3 Encounters:  04/24/21 118/75  07/18/20 126/70  01/11/20 118/72   At goal continue current medications   Diabetes mellitus due to underlying condition, controlled ([place]) Worsened.  Fortunately she continues to lose weight.  She will work on taking her medications regularly and will recheck in 6 months.  Could perhaps increase GLP1 but did have GI effects      Lind Covert, [place]

## 2021-04-25 LAB — CMP14+EGFR
ALT: 13 IU/L (ref 0–32)
AST: 14 IU/L (ref 0–40)
Albumin/Globulin Ratio: 1.9 (ref 1.2–2.2)
Albumin: 4.4 g/dL (ref 3.8–4.9)
Alkaline Phosphatase: 92 IU/L (ref 44–121)
BUN/Creatinine Ratio: 16 (ref 12–28)
BUN: 14 mg/dL (ref 8–27)
Bilirubin Total: 0.2 mg/dL (ref 0.0–1.2)
CO2: 27 mmol/L (ref 20–29)
Calcium: 9.7 mg/dL (ref 8.7–10.3)
Chloride: 103 mmol/L (ref 96–106)
Creatinine, Ser: 0.87 mg/dL (ref 0.57–1.00)
Globulin, Total: 2.3 g/dL (ref 1.5–4.5)
Glucose: 145 mg/dL — ABNORMAL HIGH (ref 65–99)
Potassium: 4.5 mmol/L (ref 3.5–5.2)
Sodium: 145 mmol/L — ABNORMAL HIGH (ref 134–144)
Total Protein: 6.7 g/dL (ref 6.0–8.5)
eGFR: 76 mL/min/{1.73_m2} (ref >59)

## 2021-04-25 LAB — LIPID PANEL
Chol/HDL Ratio: 2.1 ratio (ref 0.0–4.4)
Cholesterol, Total: 101 mg/dL (ref 100–199)
HDL: 47 mg/dL (ref >39)
LDL Chol Calc (NIH): 39 mg/dL (ref 0–99)
Triglycerides: 67 mg/dL (ref 0–149)
VLDL Cholesterol Cal: 15 mg/dL (ref 5–40)

## 2021-04-30 ENCOUNTER — Other Ambulatory Visit: Payer: Self-pay

## 2021-05-01 MED ORDER — ATORVASTATIN CALCIUM 40 MG PO TABS: ORAL_TABLET | ORAL | 3 refills | Status: DC

## 2021-05-17 ENCOUNTER — Other Ambulatory Visit: Payer: Self-pay | Admitting: *Deleted

## 2021-05-17 MED ORDER — GLIPIZIDE 10 MG PO TABS: ORAL_TABLET | ORAL | 0 refills | Status: DC

## 2021-05-22 ENCOUNTER — Encounter: Payer: Self-pay | Admitting: Gastroenterology

## 2021-06-28 ENCOUNTER — Other Ambulatory Visit: Payer: Self-pay | Admitting: Family Medicine

## 2021-06-28 MED ORDER — HYDROCHLOROTHIAZIDE 25 MG PO TABS: 25.0000 mg | ORAL_TABLET | Freq: Every day | ORAL | 0 refills | Status: DC

## 2021-07-18 ENCOUNTER — Other Ambulatory Visit: Payer: Self-pay | Admitting: Family Medicine

## 2021-08-21 ENCOUNTER — Encounter: Payer: Self-pay | Admitting: Family Medicine

## 2021-08-30 ENCOUNTER — Encounter: Payer: Self-pay | Admitting: Family Medicine

## 2021-08-30 ENCOUNTER — Other Ambulatory Visit: Payer: Self-pay | Admitting: Family Medicine

## 2021-08-30 ENCOUNTER — Other Ambulatory Visit: Payer: Self-pay | Admitting: *Deleted

## 2021-08-30 MED ORDER — HYDROCHLOROTHIAZIDE 25 MG PO TABS: 25.0000 mg | ORAL_TABLET | Freq: Every day | ORAL | 1 refills | Status: DC

## 2021-08-30 MED ORDER — ATORVASTATIN CALCIUM 40 MG PO TABS: ORAL_TABLET | ORAL | 3 refills | Status: DC

## 2021-08-30 MED ORDER — GLIPIZIDE 10 MG PO TABS: ORAL_TABLET | ORAL | 1 refills | Status: DC

## 2021-09-03 NOTE — Patient Instructions (Signed)
Good to see you today - Thank you for coming in  Things we discussed today:  Diabetes Increase Lantus by one unit each morning your blood sugar is greater than 150.   Cut back on grapefruit juice  You need a mammogram to prevent breast cancer.  Please schedule an appointment.  You can call [phone].     You need an diabetes eye exam every year.  Please see your eye doctor.  Ask them to fax [place] a report of your exam   Get your flu shot after Xmas   You should exercise at least 20 minutes every day.    Choose something you like the most or hate the least.   Having a set time every day and having a partner will help you stick to it.    If you are too tired try to do at least 5 minutes, it often gets easier.   Please always bring your medication bottles  Come back to see me in 3 months

## 2021-09-03 NOTE — Progress Notes (Signed)
° ° °  SUBJECTIVE:   CHIEF COMPLAINT / HPI:   Diabetes Her blood sugar are in upper 100s.  Has been drinking a lot of grapefruit juice.  Taking all her medications as listed  Hypertension Taking hctz daily.    Lipid Daily lipitor.    PERTINENT  PMH / PSH: Finished her holiday   OBJECTIVE:   BP 120/69    Pulse 82    Wt 188 lb 12.8 oz (85.6 kg)    SpO2 99%    BMI 30.47 kg/m   Heart - Regular rate and rhythm.  No murmurs, gallops or rubs.    Lungs:  Normal respiratory effort, chest expands symmetrically. Lungs are clear to auscultation, no crackles or wheezes. Extremities:  No cyanosis, edema, or deformity noted with good range of motion of all major joints.     ASSESSMENT/PLAN:   ESSENTIAL HYPERTENSION, BENIGN BP Readings from Last 3 Encounters:  09/04/21 120/69  04/24/21 118/75  07/18/20 126/70   At goal continue current medications   Diabetes mellitus due to underlying condition, controlled ([place]) Not well controlled despite continuing to lose weight.  Suggest diet changes and titrate lantus.       Lind Covert, MD

## 2021-09-04 ENCOUNTER — Other Ambulatory Visit: Payer: Self-pay

## 2021-09-04 ENCOUNTER — Ambulatory Visit (INDEPENDENT_AMBULATORY_CARE_PROVIDER_SITE_OTHER): Payer: 59 | Admitting: Family Medicine

## 2021-09-04 ENCOUNTER — Encounter: Payer: Self-pay | Admitting: Family Medicine

## 2021-09-04 VITALS — BP 120/69 | HR 82 | Wt 188.8 lb

## 2021-09-04 DIAGNOSIS — Z794 Long term (current) use of insulin: Secondary | ICD-10-CM

## 2021-09-04 DIAGNOSIS — I10 Essential (primary) hypertension: Secondary | ICD-10-CM | POA: Diagnosis not present

## 2021-09-04 DIAGNOSIS — E089 Diabetes mellitus due to underlying condition without complications: Secondary | ICD-10-CM

## 2021-09-04 LAB — POCT GLYCOSYLATED HEMOGLOBIN (HGB A1C): HbA1c, POC (controlled diabetic range): 9.3 % — AB (ref 0.0–7.0)

## 2021-09-04 NOTE — Assessment & Plan Note (Signed)
Not well controlled despite continuing to lose weight.  Suggest diet changes and titrate lantus.

## 2021-09-04 NOTE — Assessment & Plan Note (Signed)
BP Readings from Last 3 Encounters:  09/04/21 120/69  04/24/21 118/75  07/18/20 126/70   At goal continue current medications

## 2021-09-17 ENCOUNTER — Other Ambulatory Visit: Payer: Self-pay | Admitting: Family Medicine

## 2021-09-30 ENCOUNTER — Encounter: Payer: Self-pay | Admitting: Family Medicine

## 2021-10-03 MED ORDER — LEVEMIR FLEXTOUCH 100 UNIT/ML ~~LOC~~ SOPN: 24.0000 [IU] | PEN_INJECTOR | Freq: Every day | SUBCUTANEOUS | 2 refills | Status: DC

## 2021-10-04 ENCOUNTER — Telehealth: Payer: Self-pay

## 2021-10-04 NOTE — Telephone Encounter (Signed)
Received denial for Victoza. Called and spoke with insurance company. Insurance denied as there was not adequate documentation that patient has tried and failed metformin containing product or other insulin There needs to be documentation in office visit note that states inadequate response to metformin and any other forms of insulin.   Representative states that there is not a covered alternative for Victoza that does not require this documentation.   You can initiate an appeal at [phone]. However, this documentation will need to be submitted with this appeal.   Talbot Grumbling, RN

## 2021-10-08 NOTE — Telephone Encounter (Signed)
I spoke with representative.    Took from 140-153  They said we had the incorrect ICD-10 code  Need to fax information [phone] Office note 04-28-18  and problem list  Prior auth team  Michela Pitcher could not hear at the end.

## 2021-10-08 NOTE — Telephone Encounter (Signed)
Received phone call from insurance company. Medication has been approved from 10/08/21-10/08/22.  Called pharmacy with approval.   Talbot Grumbling, RN

## 2021-10-08 NOTE — Telephone Encounter (Signed)
Fax machine is currently down, placed paperwork in "to be faxed pile" for when fax is working again.   Talbot Grumbling, RN

## 2021-10-24 ENCOUNTER — Encounter: Payer: Self-pay | Admitting: Family Medicine

## 2021-10-29 MED ORDER — INSULIN GLARGINE-YFGN 100 UNIT/ML ~~LOC~~ SOPN: 24.0000 [IU] | PEN_INJECTOR | Freq: Every day | SUBCUTANEOUS | 6 refills | Status: DC

## 2021-11-01 LAB — HM DIABETES EYE EXAM

## 2021-11-09 ENCOUNTER — Other Ambulatory Visit: Payer: Self-pay | Admitting: Family Medicine

## 2021-11-09 ENCOUNTER — Encounter: Payer: Self-pay | Admitting: Family Medicine

## 2021-11-14 ENCOUNTER — Encounter: Payer: Self-pay | Admitting: Family Medicine

## 2021-12-27 ENCOUNTER — Other Ambulatory Visit: Payer: Self-pay | Admitting: Family Medicine

## 2021-12-27 DIAGNOSIS — E119 Type 2 diabetes mellitus without complications: Secondary | ICD-10-CM

## 2022-01-27 ENCOUNTER — Other Ambulatory Visit: Payer: Self-pay | Admitting: Family Medicine

## 2022-01-29 ENCOUNTER — Encounter: Payer: Self-pay | Admitting: Family Medicine

## 2022-01-31 MED ORDER — ACCU-CHEK AVIVA PLUS VI STRP: ORAL_STRIP | 0 refills | Status: AC

## 2022-02-04 ENCOUNTER — Other Ambulatory Visit (HOSPITAL_COMMUNITY): Payer: Self-pay

## 2022-02-04 ENCOUNTER — Telehealth: Payer: Self-pay

## 2022-02-04 NOTE — Telephone Encounter (Signed)
Rec'd PA fax from pt's pharmacy. Per pharmacy: Insurance prefers Contour brand testing supplies.  Cannot locate patients insurance on my end.

## 2022-02-07 ENCOUNTER — Other Ambulatory Visit (HOSPITAL_COMMUNITY): Payer: Self-pay

## 2022-02-19 ENCOUNTER — Encounter: Payer: Self-pay | Admitting: *Deleted

## 2022-02-20 MED ORDER — CONTOUR TEST VI STRP: ORAL_STRIP | 3 refills | Status: AC

## 2022-02-20 NOTE — Addendum Note (Signed)
Addended by: Talbert Cage L on: 02/20/2022 08:13 AM   Modules accepted: Orders

## 2022-02-20 NOTE — Telephone Encounter (Signed)
Rx sent 

## 2022-02-22 MED ORDER — CONTOUR BLOOD GLUCOSE SYSTEM W/DEVICE KIT: PACK | 0 refills | Status: AC

## 2022-02-22 NOTE — Addendum Note (Signed)
Addended by: Talbert Cage L on: 02/22/2022 03:49 PM   Modules accepted: Orders

## 2022-02-26 ENCOUNTER — Other Ambulatory Visit: Payer: Self-pay | Admitting: Family Medicine

## 2022-02-26 ENCOUNTER — Ambulatory Visit: Payer: Self-pay | Admitting: Family Medicine

## 2022-02-26 MED ORDER — VICTOZA 18 MG/3ML ~~LOC~~ SOPN: PEN_INJECTOR | SUBCUTANEOUS | 0 refills | Status: DC

## 2022-03-04 NOTE — Progress Notes (Signed)
    SUBJECTIVE:   CHIEF COMPLAINT / HPI:   Diabetes Fasting blood sugar usually in 120s. None low. Increased victoza to 1.8.  Tolerating ok   Cholesterol Lab Results  Component Value Date   CHOL 101 04/24/2021   HDL 47 04/24/2021   LDLCALC 39 04/24/2021   TRIG 67 04/24/2021   CHOLHDL 2.1 04/24/2021   Taking atorvastatin regularly Knows all her medications  Hypertension HCTZ regularly.  Doesnot check blood pressure at  home but mom has a cuff  PERTINENT  PMH / PSH:   OBJECTIVE:   BP 134/88   Pulse 75   Wt 191 lb 9.6 oz (86.9 kg)   SpO2 97%   BMI 30.93 kg/m   Heart - Regular rate and rhythm.  No murmurs, gallops or rubs.    Lungs:  Normal respiratory effort, chest expands symmetrically. Lungs are clear to auscultation, no crackles or wheezes. Extremities:  No cyanosis, edema, or deformity noted with good range of motion of all major joints.     ASSESSMENT/PLAN:   ESSENTIAL HYPERTENSION, BENIGN Mildly elevated.  Discussed adding ARB and possible benefits.  She will consider and monitor at home  Hyperlipidemia associated with type 2 diabetes mellitus ([place]) stable  Type 2 diabetes mellitus with hypercholesterolemia ([place]) Improved control with increased Victoza dose.  Continue current medications      Lind Covert, MD [place]

## 2022-03-05 ENCOUNTER — Ambulatory Visit (INDEPENDENT_AMBULATORY_CARE_PROVIDER_SITE_OTHER): Payer: 59 | Admitting: Family Medicine

## 2022-03-05 ENCOUNTER — Other Ambulatory Visit: Payer: Self-pay

## 2022-03-05 VITALS — BP 134/88 | HR 75 | Wt 191.6 lb

## 2022-03-05 DIAGNOSIS — E1165 Type 2 diabetes mellitus with hyperglycemia: Secondary | ICD-10-CM | POA: Diagnosis not present

## 2022-03-05 DIAGNOSIS — E785 Hyperlipidemia, unspecified: Secondary | ICD-10-CM

## 2022-03-05 DIAGNOSIS — I10 Essential (primary) hypertension: Secondary | ICD-10-CM | POA: Diagnosis not present

## 2022-03-05 DIAGNOSIS — E1169 Type 2 diabetes mellitus with other specified complication: Secondary | ICD-10-CM | POA: Diagnosis not present

## 2022-03-05 DIAGNOSIS — E119 Type 2 diabetes mellitus without complications: Secondary | ICD-10-CM

## 2022-03-05 DIAGNOSIS — E7800 Pure hypercholesterolemia, unspecified: Secondary | ICD-10-CM

## 2022-03-05 LAB — POCT GLYCOSYLATED HEMOGLOBIN (HGB A1C): Hemoglobin A1C: 7.9 % — AB (ref 4.0–5.6)

## 2022-03-05 NOTE — Assessment & Plan Note (Addendum)
" >>  ASSESSMENT AND PLAN FOR TYPE 2 DIABETES MELLITUS WITH HYPERCHOLESTEROLEMIA (HCC) WRITTEN ON 03/05/2022  8:55 AM BY Kenyah Luba L, MD  Improved control with increased Victoza  dose.  Continue current medications    >>ASSESSMENT AND PLAN FOR HYPERLIPIDEMIA ASSOCIATED WITH TYPE 2 DIABETES MELLITUS (HCC) WRITTEN ON 03/05/2022  8:54 AM BY Ingram Onnen L, MD  stable "

## 2022-03-05 NOTE — Assessment & Plan Note (Signed)
Mildly elevated.  Discussed adding ARB and possible benefits.  She will consider and monitor at home

## 2022-03-05 NOTE — Patient Instructions (Signed)
Good to see you today - Thank you for coming in  Things we discussed today:  You need a mammogram to prevent breast cancer.  Please schedule an appointment.  You can call [phone].     You should exercise at least 20 minutes every day.    Choose something you like the most or hate the least.   Having a set time every day and having a partner will help you stick to it.    If you are too tired try to do at least 5 minutes, it often gets easier.   Your goal blood pressure is less than 140/90.  Check your blood pressure several times a week.  If regularly higher than this please let me know - either with MyChart or leaving a phone message. Next visit please bring in your blood pressure cuff.      Please always bring your medication bottles  Come back to see me in 3 month

## 2022-03-05 NOTE — Assessment & Plan Note (Signed)
stable °

## 2022-03-25 ENCOUNTER — Other Ambulatory Visit: Payer: Self-pay | Admitting: Family Medicine

## 2022-03-25 DIAGNOSIS — E119 Type 2 diabetes mellitus without complications: Secondary | ICD-10-CM

## 2022-04-02 ENCOUNTER — Encounter: Payer: Self-pay | Admitting: Family Medicine

## 2022-04-15 ENCOUNTER — Other Ambulatory Visit: Payer: Self-pay | Admitting: Family Medicine

## 2022-04-16 ENCOUNTER — Other Ambulatory Visit: Payer: Self-pay | Admitting: *Deleted

## 2022-04-16 ENCOUNTER — Other Ambulatory Visit: Payer: Self-pay

## 2022-04-16 MED ORDER — VICTOZA 18 MG/3ML ~~LOC~~ SOPN
1.8000 mg | PEN_INJECTOR | Freq: Every day | SUBCUTANEOUS | 3 refills | Status: DC
  Filled 2022-04-16: qty 6, 20d supply, fill #0

## 2022-04-16 MED ORDER — VICTOZA 18 MG/3ML ~~LOC~~ SOPN: 1.8000 mg | PEN_INJECTOR | Freq: Every day | SUBCUTANEOUS | 3 refills | Status: DC

## 2022-05-27 ENCOUNTER — Other Ambulatory Visit: Payer: Self-pay | Admitting: Family Medicine

## 2022-05-30 ENCOUNTER — Telehealth: Payer: Self-pay

## 2022-05-30 ENCOUNTER — Other Ambulatory Visit (HOSPITAL_COMMUNITY): Payer: Self-pay

## 2022-05-30 NOTE — Telephone Encounter (Signed)
A Prior Authorization was initiated for this patients VICTOZA through CoverMyMeds.   Key: [id number]

## 2022-06-03 ENCOUNTER — Other Ambulatory Visit (HOSPITAL_COMMUNITY): Payer: Self-pay

## 2022-06-03 NOTE — Telephone Encounter (Signed)
Prior Auth for patients medication VICTOZA approved by [facility] from 06/03/22 to 06/04/23.  Key: Theresa Miles

## 2022-06-03 NOTE — Telephone Encounter (Signed)
Prior Auth for patients medication VICTOZA denied by [facility] [employer] via CoverMyMeds.   Reason: STEP THERAPY REQUIRED. Pt will need to contact plan for alternative medications. Not sure of her specific plan. Will resubmit PA if needed.  CoverMyMeds Key: [id number]

## 2022-06-03 NOTE — Telephone Encounter (Signed)
Resubmitted PA with updated info.  CoverMyMed Key: [employer]

## 2022-06-04 ENCOUNTER — Encounter: Payer: Self-pay | Admitting: Family Medicine

## 2022-06-05 ENCOUNTER — Other Ambulatory Visit: Payer: Self-pay | Admitting: Family Medicine

## 2022-06-05 DIAGNOSIS — Z1231 Encounter for screening mammogram for malignant neoplasm of breast: Secondary | ICD-10-CM

## 2022-06-05 MED ORDER — VICTOZA 18 MG/3ML ~~LOC~~ SOPN: 1.8000 mg | PEN_INJECTOR | Freq: Every day | SUBCUTANEOUS | 3 refills | Status: DC

## 2022-06-05 MED ORDER — BASAGLAR KWIKPEN 100 UNIT/ML ~~LOC~~ SOPN: 24.0000 [IU] | PEN_INJECTOR | SUBCUTANEOUS | 11 refills | Status: DC

## 2022-06-06 ENCOUNTER — Ambulatory Visit
Admission: RE | Admit: 2022-06-06 | Discharge: 2022-06-06 | Disposition: A | Discharge status: Home / Self Care | Payer: 59 | Source: Ambulatory Visit | Attending: Family Medicine | Admitting: Family Medicine

## 2022-06-06 DIAGNOSIS — Z1231 Encounter for screening mammogram for malignant neoplasm of breast: Secondary | ICD-10-CM

## 2022-06-25 ENCOUNTER — Encounter: Payer: Self-pay | Admitting: Family Medicine

## 2022-06-25 MED ORDER — ATORVASTATIN CALCIUM 40 MG PO TABS
ORAL_TABLET | ORAL | 3 refills | Status: AC
Start: 2022-06-25 — End: ?

## 2022-07-12 ENCOUNTER — Encounter: Payer: Self-pay | Admitting: Family Medicine

## 2022-07-12 DIAGNOSIS — I10 Essential (primary) hypertension: Secondary | ICD-10-CM

## 2022-07-15 MED ORDER — HYDROCHLOROTHIAZIDE 25 MG PO TABS: ORAL_TABLET | ORAL | 1 refills | Status: DC

## 2022-08-27 ENCOUNTER — Other Ambulatory Visit: Payer: Self-pay | Admitting: Family Medicine

## 2022-08-31 ENCOUNTER — Other Ambulatory Visit: Payer: Self-pay | Admitting: Family Medicine

## 2022-08-31 DIAGNOSIS — E119 Type 2 diabetes mellitus without complications: Secondary | ICD-10-CM

## 2022-10-02 ENCOUNTER — Other Ambulatory Visit: Payer: Self-pay | Admitting: Family Medicine

## 2022-10-02 ENCOUNTER — Encounter: Payer: Self-pay | Admitting: Family Medicine

## 2022-10-20 ENCOUNTER — Other Ambulatory Visit: Payer: Self-pay | Admitting: Family Medicine

## 2022-10-26 ENCOUNTER — Other Ambulatory Visit: Payer: Self-pay | Admitting: Family Medicine

## 2022-10-26 DIAGNOSIS — E119 Type 2 diabetes mellitus without complications: Secondary | ICD-10-CM

## 2022-11-07 ENCOUNTER — Encounter: Payer: Self-pay | Admitting: Family Medicine

## 2022-11-08 ENCOUNTER — Other Ambulatory Visit (HOSPITAL_COMMUNITY): Payer: Self-pay

## 2022-11-08 ENCOUNTER — Telehealth: Payer: Self-pay

## 2022-11-08 NOTE — Telephone Encounter (Signed)
A Prior Authorization was initiated for this patients VICTOZA through CoverMyMeds.   Key: [id number]

## 2022-11-11 ENCOUNTER — Ambulatory Visit (INDEPENDENT_AMBULATORY_CARE_PROVIDER_SITE_OTHER): Payer: Self-pay | Admitting: Family Medicine

## 2022-11-11 ENCOUNTER — Other Ambulatory Visit: Payer: Self-pay

## 2022-11-11 ENCOUNTER — Encounter: Payer: Self-pay | Admitting: Family Medicine

## 2022-11-11 ENCOUNTER — Telehealth: Payer: Self-pay | Admitting: Pharmacist

## 2022-11-11 VITALS — BP 126/80 | HR 87 | Ht 66.0 in | Wt 200.8 lb

## 2022-11-11 DIAGNOSIS — Z87891 Personal history of nicotine dependence: Secondary | ICD-10-CM

## 2022-11-11 DIAGNOSIS — I10 Essential (primary) hypertension: Secondary | ICD-10-CM

## 2022-11-11 DIAGNOSIS — E7800 Pure hypercholesterolemia, unspecified: Secondary | ICD-10-CM

## 2022-11-11 DIAGNOSIS — E6609 Other obesity due to excess calories: Secondary | ICD-10-CM

## 2022-11-11 DIAGNOSIS — E1169 Type 2 diabetes mellitus with other specified complication: Secondary | ICD-10-CM

## 2022-11-11 LAB — POCT GLYCOSYLATED HEMOGLOBIN (HGB A1C): Hemoglobin A1C: 10.1 % — AB (ref 4.0–5.6)

## 2022-11-11 MED ORDER — DEXCOM G7 SENSOR MISC: 11 refills | Status: DC

## 2022-11-11 MED ORDER — SEMAGLUTIDE(0.25 OR 0.5MG/DOS) 2 MG/3ML ~~LOC~~ SOPN: 0.2500 mg | PEN_INJECTOR | SUBCUTANEOUS | 5 refills | Status: DC

## 2022-11-11 NOTE — Progress Notes (Signed)
    SUBJECTIVE:   CHIEF COMPLAINT / HPI:   Diabetes No victoza since September due to insurance.  Attributes weight gain to this.  Thinks her new insurance will pay Takes Metformin 500 mg at night because sometimes her AM blood sugar are in 60s but not often Takes Insulin 24 u and Glipizide in AM Knows her medications did not bring in Semi interested in CGM  Hypertension Daily hctz  Cholesterol Daily lipitor   OBJECTIVE:   BP 126/80   Pulse 87   Ht 5' 6"$  (1.676 m)   Wt 200 lb 12.8 oz (91.1 kg)   SpO2 100%   BMI 32.41 kg/m   Heart - Regular rate and rhythm.  No murmurs, gallops or rubs.    Lungs:  Normal respiratory effort, chest expands symmetrically. Lungs are clear to auscultation, no crackles or wheezes. Extremities:  No cyanosis, edema, or deformity noted with good range of motion of all major joints.     ASSESSMENT/PLAN:   Type 2 diabetes mellitus with hypercholesterolemia ([place]) Assessment & Plan: Worsened control due to being off victoza for months.  Will increase insulin - see after visit summary and await victoza from insurance and refer for CGM  Orders: -     POCT glycosylated hemoglobin (Hb A1C) -     Microalbumin / creatinine urine ratio  Essential hypertension, benign Assessment & Plan: At goal today.  Check labs   Orders: -     Comprehensive metabolic panel  Class 2 obesity due to excess calories without serious comorbidity in adult, unspecified BMI Assessment & Plan: Worsened  Resume victoza    TOBACCO USE, QUIT     Patient Instructions  Good to see you today - Thank you for coming in  Things we discussed today:  Diabetes - Increase your basaglar to 26 units and then go up one unit every morning that your fasting is > 130  - will investigate CBG monitoring and let you know.  If you dont hear from me in 2 weeks let me know  You should exercise at least 20 minutes every day.    Choose something you like the most or hate the least.    Having a set time every day and having a partner will help you stick to it.    If you are too tired try to do at least 5 minutes, it often gets easier.   I will call you if your tests are not good.  Otherwise, I will send you a message on MyChart (if it is active) or a letter in the mail..  If you do not hear from me with in 2 weeks please call our office.        Please always bring your medication bottles  Come back to see me in 3 months    Lind Covert, [place]

## 2022-11-11 NOTE — Patient Instructions (Addendum)
Good to see you today - Thank you for coming in  Things we discussed today:  Diabetes - Increase your basaglar to 26 units and then go up one unit every morning that your fasting is > 130  - will investigate CBG monitoring and let you know.  If you dont hear from me in 2 weeks let me know  You should exercise at least 20 minutes every day.    Choose something you like the most or hate the least.   Having a set time every day and having a partner will help you stick to it.    If you are too tired try to do at least 5 minutes, it often gets easier.   I will call you if your tests are not good.  Otherwise, I will send you a message on MyChart (if it is active) or a letter in the mail..  If you do not hear from me with in 2 weeks please call our office.        Please always bring your medication bottles  Come back to see me in 3 months

## 2022-11-11 NOTE — Telephone Encounter (Signed)
Contacted patient RE CGM and Diabetes Management.   Patient is ON insulin currently so she would NOT be a candidate for the CGM study.  However, she reports low morning fasting readings and also has elevated A1C of 10.1 which is most likely elevated post-prandial readings.   She has new insurance and we reviewed options.  She was comfortable to switch from Victoza (liraglutide) to Ozempic (semaglutide).    We discussed variability with insurance coverage and the possible need to add a prandial insulin (second shot of the day) prior to getting approval for her CGM (Dexcom G7 appears to be preferred).  Patient reports use of APPLE iPhone 11  New prescriptions for Dexcom G7 sensors AND Ozempic were sent to her preferred pharmacy.   She is scheduled to come in 2/29 at 4:00 PM for additional follow-up.

## 2022-11-11 NOTE — Assessment & Plan Note (Signed)
Worsened control due to being off victoza for months.  Will increase insulin - see after visit summary and await victoza from insurance and refer for CGM

## 2022-11-11 NOTE — Assessment & Plan Note (Signed)
Worsened  Resume victoza

## 2022-11-11 NOTE — Assessment & Plan Note (Signed)
At goal today.  Check labs

## 2022-11-12 LAB — COMPREHENSIVE METABOLIC PANEL
ALT: 9 IU/L (ref 0–32)
AST: 13 IU/L (ref 0–40)
Albumin/Globulin Ratio: 1.6 (ref 1.2–2.2)
Albumin: 4.1 g/dL (ref 3.9–4.9)
Alkaline Phosphatase: 88 IU/L (ref 44–121)
BUN/Creatinine Ratio: 18 (ref 12–28)
BUN: 15 mg/dL (ref 8–27)
Bilirubin Total: 0.3 mg/dL (ref 0.0–1.2)
CO2: 25 mmol/L (ref 20–29)
Calcium: 9.9 mg/dL (ref 8.7–10.3)
Chloride: 102 mmol/L (ref 96–106)
Creatinine, Ser: 0.85 mg/dL (ref 0.57–1.00)
Globulin, Total: 2.6 g/dL (ref 1.5–4.5)
Glucose: 61 mg/dL — ABNORMAL LOW (ref 70–99)
Potassium: 4.6 mmol/L (ref 3.5–5.2)
Sodium: 141 mmol/L (ref 134–144)
Total Protein: 6.7 g/dL (ref 6.0–8.5)
eGFR: 78 mL/min/{1.73_m2} (ref >59)

## 2022-11-12 LAB — MICROALBUMIN / CREATININE URINE RATIO
Creatinine, Urine: 77 mg/dL
Microalb/Creat Ratio: 6 mg/g creat (ref 0–29)
Microalbumin, Urine: 4.6 ug/mL

## 2022-11-12 NOTE — Telephone Encounter (Signed)
Reviewed and agree with Dr Graylin Shiver plan.

## 2022-11-12 NOTE — Telephone Encounter (Signed)
A Prior Authorization was initiated for this patients dexcom G7 sensor through CoverMyMeds.   Key: [id number]

## 2022-11-14 ENCOUNTER — Ambulatory Visit (INDEPENDENT_AMBULATORY_CARE_PROVIDER_SITE_OTHER): Payer: Self-pay | Admitting: Pharmacist

## 2022-11-14 ENCOUNTER — Encounter: Payer: Self-pay | Admitting: Pharmacist

## 2022-11-14 VITALS — BP 131/68 | HR 68

## 2022-11-14 DIAGNOSIS — E7800 Pure hypercholesterolemia, unspecified: Secondary | ICD-10-CM

## 2022-11-14 DIAGNOSIS — E1169 Type 2 diabetes mellitus with other specified complication: Secondary | ICD-10-CM

## 2022-11-14 MED ORDER — FREESTYLE LIBRE 3 SENSOR MISC: 11 refills | Status: DC

## 2022-11-14 NOTE — Patient Instructions (Addendum)
It was nice to see you today!  Your goal blood sugar is 80-130 before eating and less than 180 after eating.  Medication Changes:  Decrease Basaglar (insulin glargine) from 26 units to 24 units daily. If you are still seeing low blood sugars, decrease to 22 units daily.   -When you start, Ozempic (semaglutide), schedule a follow-up appointment for 3 weeks.  -Please call us if you see blood sugars less than 80.   Discontinue glipizide 10 mg daily.   Monitor blood sugars at home and keep a log (glucometer or piece of paper) to bring with you to your next visit.  Keep up the good work with diet and exercise. Aim for a diet full of vegetables, fruit and lean meats (chicken, [place], fish). Try to limit salt intake by eating fresh or frozen vegetables (instead of canned), rinse canned vegetables prior to cooking and do not add any additional salt to meals.

## 2022-11-14 NOTE — Telephone Encounter (Signed)
Prior Auth for patients medication DEXCOM G7 SENSOR denied by [facility] via CoverMyMeds.   Reason:   CoverMyMeds Key: [id number]   Dr Valentina Lucks, would you like to try Freestyle Libre CGM?

## 2022-11-14 NOTE — Assessment & Plan Note (Signed)
Diabetes longstanding currently uncontrolled based on elevated A1c. Patient is able to verbalize appropriate hypoglycemia management plan. Medication adherence appears appropriate. Control is suboptimal due to being off Victoza since September of last year. -Decreased dose of basal insulin Basaglar (insulin glargine) from 26 units to 24 units daily in the morning. Instructed patient to decrease further to 22 units daily if continues to see low blood sugars.  -Instructed to start GLP-1 Ozempic (semaglutide) 0.25 mg weekly once prior authorization is approved.  -Continued metformin 1000 mg in the morning and 500 mg in the evening.  -Discontinued glipizide 10 mg daily.   -Patient educated on purpose, proper use, and potential adverse effects. -Extensively discussed pathophysiology of diabetes, recommended lifestyle interventions, dietary effects on blood sugar control.  -Awaiting prior authorization approval for Dexcom G7 CGM. Patient instructed to schedule follow-up visit once approved to assist with application and starting. Following visit, determined that [facility] 3 was preferred CGM.  New prescription provided.  -Counseled on s/sx of and management of hypoglycemia.

## 2022-11-14 NOTE — Progress Notes (Signed)
S:    Chief Complaint  Patient presents with   Medication Management    DM Initial    62 y.o. female who presents for diabetes evaluation, education, and management.  PMH is significant for T2DM and hypertension.  Patient was referred and last seen by Primary Care Provider, Dr. Erin Hearing, on 11/11/2022.   At last visit, A1c was elevated at 10.1% after being off Victoza since September.   Today, patient arrives in good spirits and presents without any assistance.   Current diabetes medications include: glipizide 10 mg daily, metformin 1000 mg in the morning and 500 mg in the evening, Basaglar (insulin glargine) 26 units daily.  Current hypertension medications include: hydrochlorothiazide 25 mg daily.  Current hyperlipidemia medications include: none   Patient reports adherence to taking all medications as prescribed.   Do you feel that your medications are working for you? yes Have you been experiencing any side effects to the medications prescribed? no Do you have any problems obtaining medications due to transportation or finances? no Insurance coverage: [employer]   Patient reports hypoglycemic events (BG 56-70s). Patient reports low blood sugars about 2-3 times a week. Patient rescues with a small cup of juice.    Patient denies nocturia (nighttime urination).  Patient denies neuropathy (nerve pain). Patient denies visual changes. Patient reports self foot exams.   Patient reported dietary habits: Eats 1-2 meals/day Lunch: skips  O:   Review of Systems  All other systems reviewed and are negative.   Physical Exam Constitutional:      Appearance: Normal appearance.  Pulmonary:     Effort: Pulmonary effort is normal.     Breath sounds: Normal breath sounds.  Neurological:     Mental Status: She is alert.  Psychiatric:        Mood and Affect: Mood normal.        Behavior: Behavior normal.        Thought Content: Thought content normal.        Judgment: Judgment  normal.     Lab Results  Component Value Date   HGBA1C 10.1 (A) 11/11/2022   Vitals:   11/14/22 1601 11/14/22 1606  BP: (!) 147/85 131/68  Pulse: 68   SpO2: 100%     Lipid Panel     Component Value Date/Time   CHOL 101 04/24/2021 0924   TRIG 67 04/24/2021 0924   HDL 47 04/24/2021 0924   CHOLHDL 2.1 04/24/2021 0924   CHOLHDL 2.8 12/11/2011 0926   VLDL 15 12/11/2011 0926   LDLCALC 39 04/24/2021 0924    Clinical Atherosclerotic Cardiovascular Disease (ASCVD): No  The ASCVD Risk score (Arnett DK, et al., 2019) failed to calculate for the following reasons:   The valid total cholesterol range is 130 to 320 mg/dL    A/P: Diabetes longstanding currently uncontrolled based on elevated A1c. Patient is able to verbalize appropriate hypoglycemia management plan. Medication adherence appears appropriate. Control is suboptimal due to being off Victoza since September of last year. -Decreased dose of basal insulin Basaglar (insulin glargine) from 26 units to 24 units daily in the morning. Instructed patient to decrease further to 22 units daily if continues to see low blood sugars.  -Instructed to start GLP-1 Ozempic (semaglutide) 0.25 mg weekly once prior authorization is approved.  -Continued metformin 1000 mg in the morning and 500 mg in the evening.  -Discontinued glipizide 10 mg daily.   -Patient educated on purpose, proper use, and potential adverse effects. -Extensively discussed pathophysiology  of diabetes, recommended lifestyle interventions, dietary effects on blood sugar control.  -Awaiting prior authorization approval for Dexcom G7 CGM. Patient instructed to schedule follow-up visit once approved to assist with application and starting. Following visit, determined that [facility] 3 was preferred CGM.  New prescription provided.  -Counseled on s/sx of and management of hypoglycemia.  -Next A1c anticipated 3 months.   Written patient instructions provided. Patient verbalized  understanding of treatment plan.  Total time in face to face counseling 20 minutes.    Follow-up:  Pharmacist 11/20/2022. Patient seen with Francena Hanly, PharmD PGY-1 Pharmacy Resident and Joseph Art, PharmD, PGY2 Pharmacy Resident.

## 2022-11-14 NOTE — Telephone Encounter (Signed)
Prior Auth for patients medication VICTOZA denied by [facility] via CoverMyMeds.   Reason:   CoverMyMeds Key: [id number]  MED CHANGED TO OZEMPIC

## 2022-11-14 NOTE — Telephone Encounter (Signed)
A Prior Authorization was initiated for this patients OZEMPIC through CoverMyMeds.   Key: [id number]

## 2022-11-15 ENCOUNTER — Telehealth: Payer: Self-pay

## 2022-11-15 ENCOUNTER — Other Ambulatory Visit (HOSPITAL_COMMUNITY): Payer: Self-pay

## 2022-11-15 NOTE — Telephone Encounter (Signed)
Prior Auth for patients medication OZEMPIC approved by [facility] from 11/15/22 to 11/15/23.  CoverMyMeds Key: [id number]

## 2022-11-15 NOTE — Progress Notes (Signed)
Reviewed and agree with Dr Graylin Shiver plan.

## 2022-11-15 NOTE — Telephone Encounter (Signed)
A Prior Authorization was initiated for this patients FREESTYLE LIBRE 3 SENSOR through CoverMyMeds.   [id number]

## 2022-11-18 NOTE — Telephone Encounter (Signed)
Prior Auth for patients medication FREESTYLE LIBRE 3 SENSOR denied by [facility] via CoverMyMeds.   Reason:   CoverMyMeds Key: [id number]  An electronic appeal is available and ready. Please provide specific, detailed clinical information/rationale of your patient's health status to address their denial reasons.

## 2022-11-19 ENCOUNTER — Encounter: Payer: Self-pay | Admitting: Family Medicine

## 2022-11-19 ENCOUNTER — Ambulatory Visit: Payer: Self-pay | Admitting: Pharmacist

## 2022-11-19 NOTE — Telephone Encounter (Signed)
Reviewed and agree with Dr Graylin Shiver plan.

## 2022-11-19 NOTE — Telephone Encounter (Signed)
Contacted patient - discussed denial and need to prescribe /order 3x daily testing for her Contour blood glucose meter AND in 1 month we may be able to consider a new CGM plan.   Patient continues to report low readings - blood glucose in the 50s AND post-prandial readings > 200.   I asked her to check more often, 3 times daily ordered.  Ozempic was approved and she plans to pick up later today if available at her pharmacy.   Due to her low readings, we agreed to decrease her long acting insulin from 22 to 18 units once daily.  May need to decrease further with initiation of Ozempic (semaglutide).   We scheduled a 4 week follow-up with glucose control.

## 2022-11-20 ENCOUNTER — Ambulatory Visit: Payer: Self-pay | Admitting: Pharmacist

## 2022-11-22 ENCOUNTER — Other Ambulatory Visit: Payer: Self-pay | Admitting: Family Medicine

## 2022-12-17 ENCOUNTER — Ambulatory Visit (INDEPENDENT_AMBULATORY_CARE_PROVIDER_SITE_OTHER): Payer: Self-pay | Admitting: Pharmacist

## 2022-12-17 VITALS — BP 107/73 | HR 71 | Wt 194.8 lb

## 2022-12-17 DIAGNOSIS — E1169 Type 2 diabetes mellitus with other specified complication: Secondary | ICD-10-CM

## 2022-12-17 DIAGNOSIS — E7800 Pure hypercholesterolemia, unspecified: Secondary | ICD-10-CM

## 2022-12-17 MED ORDER — FREESTYLE LIBRE 3 SENSOR MISC: 11 refills | Status: DC

## 2022-12-17 NOTE — Progress Notes (Signed)
S:     Chief Complaint  Patient presents with   Medication Management    Diabetes management - CGM    62 y.o. female who presents for diabetes evaluation, education, and management.  PMH is significant for T2DM and HTN.   Patient was referred and last seen by Primary Care Provider, Dr. Erin Hearing, on 11/11/22.    At last visit, attempted to initiate CGM; however due to insurance issues, she was not able to obtain it due to the required testing frequency.   Today, patient arrives in good spirits and presents without any assistance.   Current diabetes medications include: basaglar (insulin glargine) kwikpen 18 units every morning, Ozempic (semaglutide) 0.5 mg once weekly, and metformin 2000 mg every morning. Current hypertension medications include: hydrochlorothiazide 25 mg daily.  Current hyperlipidemia medications include: atorvastatin 40 mg daily.   Patient reports adherence to taking all medications as prescribed. Patient denies adherence with medications, does not report missing any medications during the week.   Do you feel that your medications are working for you? yes Have you been experiencing any side effects to the medications prescribed? no Do you have any problems obtaining medications due to transportation or finances? No Insurance coverage: [employer]   Patient reports having to reduce her Basaglar (insuline glargine) dose to 18 units daily due to low sugars while on her Ozempic (semaglutide).   Patient denies nocturia (nighttime urination).  Patient denies neuropathy (nerve pain). Patient denies visual changes. Patient denies self foot exams.    O:  Review of Systems  All other systems reviewed and are negative.  Physical Exam Vitals reviewed.    Lab Results  Component Value Date   HGBA1C 10.1 (A) 11/11/2022   Vitals:   12/17/22 1009  BP: 107/73  Pulse: 71    Lipid Panel     Component Value Date/Time   CHOL 101 04/24/2021 0924   TRIG 67  04/24/2021 0924   HDL 47 04/24/2021 0924   CHOLHDL 2.1 04/24/2021 0924   CHOLHDL 2.8 12/11/2011 0926   VLDL 15 12/11/2011 0926   LDLCALC 39 04/24/2021 0924    Clinical Atherosclerotic Cardiovascular Disease (ASCVD): No  The ASCVD Risk score (Arnett DK, et al., 2019) failed to calculate for the following reasons:   The valid total cholesterol range is 130 to 320 mg/dL   A/P: Diabetes longstanding currently uncontrolled with most recent A1c 10.1 from February 2024. Patient is able to verbalize appropriate hypoglycemia management plan. Medication adherence appears appropriate.  - Started utilizing [employer] 3 CGM to monitor blood sugars. Account has been programmed and set up in the patient's smart phone. Patient instructed on how to utilize and interpret results from CGM device as well as correctly apply the sensor device. (Samples sensor utilized and 1 sensor sample provided). - Continued  basal insulin Basaglar (insulin glargine) 18 units every morning. Instructed to decrease dose further if she experiences hypoglycemic effects.  -Continued GLP-1 Ozempic (semaglutide) 0.5 mg once weekly.   -Continued metformin 2000 mg every morning and 500 mg every evening. Patient reports no adverse GI effects from this dose.  -Patient educated on purpose, proper use, and potential adverse effects.  -Encouraged lifestyle interventions, dietary effects on blood sugar control.  -Counseled on s/sx of and management of hypoglycemia.  -Next A1c anticipated May 2024.    Written patient instructions provided. Patient verbalized understanding of treatment plan.  Total time in face to face counseling 20 minutes.    Follow-up:  Pharmacist 01/08/23. Patient  seen with Gena Fray, PharmD PGY-1 Pharmacy Resident and Estelle June, PharmD Candidate.

## 2022-12-17 NOTE — Patient Instructions (Addendum)
We enjoyed your visit today.  Continue taking your basaglar, semaglutide, and metformin.  Start using your Freestyle Libre 3 to monitor your blood sugars. Monitor your blood sugar and for symptoms of low blood sugar.   Follow up with [place] in 3 weeks.  Sensor Application If using the App, you can tap Help in the Main Menu to access an in-app tutorial on applying a Sensor. See below for instructions on how to download the app. Apply Sensors only on the back of your upper arm. If placed in other areas, the Sensor may not function properly and could give you inaccurate readings. Avoid areas with scars, moles, stretch marks, or lumps.   Select an area of skin that generally stays flat during your normal daily activities (no bending or folding). Choose a site that is at least 1 inch (2.5 cm) away from any injection sites. To prevent discomfort or skin irritation, you should select a different site other than the one most recently used. Wash application site using a plain soap, dry, and then clean with an alcohol wipe. This will help remove any oily residue that may prevent the sensor from sticking properly. Allow site to air dry before proceeding. Note: The area MUST be clean and dry, or the Sensor may not stay on for the full wear duration specified by your Sensor insert. 4. Unscrew the cap from the Sensor Applicator and set the cap aside.  5. Place the Sensor Applicator over the prepared site and push down firmly to apply the Sensor to your body. 6. Gently pull the Sensor Applicator away from your body. The Sensor should now be attached to your skin. 7. Make sure the Sensor is secure after application. Put the cap back on the Sensor Applicator. Discard the used Engineer, civil (consulting) according to local regulations.  What If My Sensor Falls Off or What If My Sensor Isn't Working? Call [place] Team at [phone] Available 7 days a week from 8AM-8PM EST, excluding holidays If yo have  multiple sensors fall off prior to 14 days of use, contact [place] at [phone]   The App Download the [place] 3 App in your phone's app store   Load the app and select get started now Create an account  Tap scan new sensor Follow the prompts on the screen. If your sensor does not sync, try moving your phone slowly around the sensor. Phone cases may affect scanning. This will be the only time you have to scan the sensor until you apply a new sensor in 14 days.  There will be a 60 minute start up period until the app will display your glucose reading   How To Share Your Readings With [place] Once in the app, go to settings -> connected apps -> LibreView -> Enter Practice ID -> [id number]

## 2022-12-17 NOTE — Assessment & Plan Note (Addendum)
Diabetes longstanding currently uncontrolled with most recent A1c 10.1 from February 2024. Patient is able to verbalize appropriate hypoglycemia management plan. Medication adherence appears appropriate.  - Started utilizing [place] 3 CGM to monitor blood sugars. Account has been programmed and set up in the patient's smart phone. Patient instructed on how to utilize and interpret results from CGM device as well as correctly apply the sensor device. (Samples sensor utilized and 1 sensor sample provided). -Continued basal insulin Basaglar (insulin glargine) 18 units every morning. Instructed to decrease dose further if she experiences hypoglycemic effects.  -Continued GLP-1 Ozempic (semaglutide) 0.5 mg once weekly.   -Continued metformin 2000 mg every morning and 500 mg every evening. Patient reports no adverse GI effects from this dose.  -Patient educated on purpose, proper use, and potential adverse effects.  -Encouraged lifestyle interventions, dietary effects on blood sugar control.  -Counseled on s/sx of and management of hypoglycemia.  -Next A1c anticipated May 2024.

## 2022-12-18 NOTE — Progress Notes (Signed)
Reviewed and agree with Dr [name]'s plan.   

## 2023-01-08 ENCOUNTER — Encounter: Payer: Self-pay | Admitting: Pharmacist

## 2023-01-08 ENCOUNTER — Ambulatory Visit (INDEPENDENT_AMBULATORY_CARE_PROVIDER_SITE_OTHER): Payer: Self-pay | Admitting: Pharmacist

## 2023-01-08 VITALS — BP 128/75 | HR 69 | Wt 189.2 lb

## 2023-01-08 DIAGNOSIS — E1169 Type 2 diabetes mellitus with other specified complication: Secondary | ICD-10-CM

## 2023-01-08 DIAGNOSIS — E7800 Pure hypercholesterolemia, unspecified: Secondary | ICD-10-CM

## 2023-01-08 MED ORDER — INSULIN LISPRO (1 UNIT DIAL) 100 UNIT/ML (KWIKPEN): 4.0000 [IU] | PEN_INJECTOR | Freq: Two times a day (BID) | SUBCUTANEOUS | 11 refills | Status: DC

## 2023-01-08 NOTE — Progress Notes (Signed)
Reviewed and agree with Dr [name]'s plan.   

## 2023-01-08 NOTE — Patient Instructions (Addendum)
It was nice to see you today!  Your goal blood sugar is 80-130 before eating and less than 180 after eating.  Medication Changes: -CONTINUE Metformin 2000 mg in the morning and 500 mg in the evening daily  -BEGIN insulin 4 units daily before lunch and dinner  -DECREASE DOSE of Basaglar Kwikpen (insulin glargine) to 10 units daily  -DECREASE DOSE of Ozempic (semaglutide) to 0.5 mg MINUS 5 clicks weekly   Monitor blood sugars at home and keep a log (glucometer or piece of paper) to bring with you to your next visit.  Keep up the good work with diet and exercise. Aim for a diet full of vegetables, fruit and lean meats (chicken, [place], fish). Try to limit salt intake by eating fresh or frozen vegetables (instead of canned), rinse canned vegetables prior to cooking and do not add any additional salt to meals.

## 2023-01-08 NOTE — Progress Notes (Signed)
S:     Chief Complaint  Patient presents with   Medication Management    T2DM F/U   62 y.o. female who presents for diabetes evaluation, education, and management.  PMH is significant for T2DM, HTN.  Patient was referred and last seen by Primary Care Provider, Dr. Deirdre Priest, on 11/11/22.  At last visit, Libre 3 CGM was applied onto patient to help monitor glucose values.   Today, patient arrives in good spirits and presents without any assistance.  Current diabetes medications include: Therapist, nutritional (insulin glargine) 16 units daily, metformin 2000 mg in the morning and 500 mg in the evening, Ozempic (semaglutide) 0.5 mg weekly Current hypertension medications include: HCTZ 25 mg daily Current hyperlipidemia medications include: Atorvastatin 40 mg daily  Patient reports adherence to taking all medications as prescribed.   Do you feel that your medications are working for you? yes Have you been experiencing any side effects to the medications prescribed? Yes. Patient reports nausea with the 0.5 mg dose of Ozempic, symptoms have gotten better over time. Did not have any nausea with 0.25 mg dose. Do you have any problems obtaining medications due to transportation or finances? no Insurance coverage: Generic Commercial  Patient reports hypoglycemic events.  Patient denies nocturia (nighttime urination).  Patient denies neuropathy (nerve pain). Patient denies visual changes. Patient denies self foot exams.   Patient reported dietary habits: Patient was surprised and learned that certain foods increased her glucose much more and faster than other foods [employer], [employer], gyro). Learned that salad lead low glucose readings overnight.   Within the past 12 months, did you worry whether your food would run out before you got money to buy more? no Within the past 12 months, did the food you bought run out, and you didn't have money to get more? no  O:   Review of Systems   All other systems reviewed and are negative.   Physical Exam Constitutional:      Appearance: Normal appearance.  Pulmonary:     Effort: Pulmonary effort is normal.     Breath sounds: Normal breath sounds.  Neurological:     Mental Status: She is alert.  Psychiatric:        Mood and Affect: Mood normal.        Behavior: Behavior normal.        Thought Content: Thought content normal.        Judgment: Judgment normal.     Freestyle Libre 3 CGM Download:  % Time CGM is active: 96% Average Glucose: 209 mg/dL Glucose Management Indicator: 8.3%  Glucose Variability: 39.2% (goal <36%) Time in Goal:  - Time in range 70-180: 38% - Time above range: 60% (30% High 181-250 mg/dL, 16% Very High >109 mg/dL) - Time below range: 2% Low 54-69 mg/dL   Lab Results  Component Value Date   HGBA1C 10.1 (A) 11/11/2022   Vitals:   01/08/23 0956  BP: 128/75  Pulse: 69  SpO2: 100%    Lipid Panel     Component Value Date/Time   CHOL 101 04/24/2021 0924   TRIG 67 04/24/2021 0924   HDL 47 04/24/2021 0924   CHOLHDL 2.1 04/24/2021 0924   CHOLHDL 2.8 12/11/2011 0926   VLDL 15 12/11/2011 0926   LDLCALC 39 04/24/2021 0924    Clinical Atherosclerotic Cardiovascular Disease (ASCVD): No  The ASCVD Risk score (Arnett DK, et al., 2019) failed to calculate for the following reasons:   The valid total cholesterol  range is 130 to 320 mg/dL    A/P: Diabetes longstanding. Patient is able to verbalize appropriate hypoglycemia management plan. Medication adherence appears good. Control is suboptimal due to elevated average glucose of 206 mg/dL from CGM report. -Decreased dose of basal insulin Basaglar (insulin glargine) from 16 to 10 units daily -Begin Humalog (insulin lispro) 4 units before lunch and dinner meals daily -Decreased dose of GLP-1 Ozempic (semaglutide) from 0.5 mg to 0.5 mg MINUS 5 CLICKS weekly -Continued metformin 2000 mg in the morning and 500 mg in the evening daily.   -Extensively discussed pathophysiology of diabetes, recommended lifestyle interventions, dietary effects on blood sugar control.  -Counseled on s/sx of and management of hypoglycemia.  -Next A1c anticipated 02/09/23.   Hypertension longstanding. Blood pressure goal of <130/80 mmHg. Medication adherence good. Blood pressure control is optimal due to in-office reading under goal. -Continued HCTZ 25 mg.  Written patient instructions provided. Patient verbalized understanding of treatment plan.  Total time in face to face counseling 30 minutes.    Follow-up:  Pharmacist 02/04/23. Patient seen with Revonda Standard, PharmD Candidate.

## 2023-01-08 NOTE — Assessment & Plan Note (Signed)
Diabetes longstanding. Patient is able to verbalize appropriate hypoglycemia management plan. Medication adherence appears good. Control is suboptimal due to elevated average glucose of 206 mg/dL from CGM report. -Decreased dose of basal insulin Basaglar (insulin glargine) from 16 to 10 units daily -Begin Humalog (insulin lispro) 4 units before lunch and dinner meals daily -Decreased dose of GLP-1 Ozempic (semaglutide) from 0.5 mg to 0.5 mg MINUS 5 CLICKS weekly -Continued metformin 2000 mg in the morning and 500 mg in the evening daily.  -Extensively discussed pathophysiology of diabetes, recommended lifestyle interventions, dietary effects on blood sugar control.  -Counseled on s/sx of and management of hypoglycemia.

## 2023-01-25 ENCOUNTER — Other Ambulatory Visit: Payer: Self-pay | Admitting: Family Medicine

## 2023-01-25 DIAGNOSIS — I10 Essential (primary) hypertension: Secondary | ICD-10-CM

## 2023-01-25 DIAGNOSIS — E119 Type 2 diabetes mellitus without complications: Secondary | ICD-10-CM

## 2023-01-28 ENCOUNTER — Other Ambulatory Visit: Payer: Self-pay | Admitting: Family Medicine

## 2023-01-28 DIAGNOSIS — I10 Essential (primary) hypertension: Secondary | ICD-10-CM

## 2023-02-04 ENCOUNTER — Other Ambulatory Visit: Payer: Self-pay | Admitting: Family Medicine

## 2023-02-04 ENCOUNTER — Encounter: Payer: Self-pay | Admitting: Pharmacist

## 2023-02-04 ENCOUNTER — Ambulatory Visit (INDEPENDENT_AMBULATORY_CARE_PROVIDER_SITE_OTHER): Payer: Self-pay | Admitting: Pharmacist

## 2023-02-04 VITALS — BP 130/77 | HR 70 | Ht 65.16 in | Wt 190.2 lb

## 2023-02-04 DIAGNOSIS — E7800 Pure hypercholesterolemia, unspecified: Secondary | ICD-10-CM

## 2023-02-04 DIAGNOSIS — E1169 Type 2 diabetes mellitus with other specified complication: Secondary | ICD-10-CM

## 2023-02-04 MED ORDER — SEMAGLUTIDE(0.25 OR 0.5MG/DOS) 2 MG/3ML ~~LOC~~ SOPN: 0.2500 mg | PEN_INJECTOR | SUBCUTANEOUS | 5 refills | Status: DC

## 2023-02-04 MED ORDER — INSULIN LISPRO (1 UNIT DIAL) 100 UNIT/ML (KWIKPEN): 4.0000 [IU] | PEN_INJECTOR | Freq: Two times a day (BID) | SUBCUTANEOUS | 11 refills | Status: DC

## 2023-02-04 MED ORDER — BASAGLAR KWIKPEN 100 UNIT/ML ~~LOC~~ SOPN: 8.0000 [IU] | PEN_INJECTOR | SUBCUTANEOUS | 11 refills | Status: DC

## 2023-02-04 NOTE — Assessment & Plan Note (Signed)
Diabetes longstanding. Patient is able to verbalize appropriate hypoglycemia management plan. Medication adherence appears good. Control is improved but remains suboptimal with elevated average glucose of 185 mg/dL and glucose variability of 38.1%from CGM report.  -Decreased insulin glargine (Basaglar KwikPen) from 10 to 8 units every morning.  -Adjusted insulin lispro (Humalog) to 6 units with lunch meal and 4-6 units with dinner. -Continued semaglutide (Ozempic) 0.5 mg once weekly.   -Anticipate potential dose increase with semaglutide (Ozempic) to 1 mg once weekly with dose titration to decrease risk of nausea.  -Extensively discussed pathophysiology of diabetes, recommended lifestyle interventions, dietary effects on blood sugar control.

## 2023-02-04 NOTE — Progress Notes (Signed)
S:     Chief Complaint  Patient presents with   Medication Management    DM F/U   62 y.o. female who presents for diabetes evaluation, education, and management.  PMH is significant for T2DM, HTN.  Patient was referred and last seen by Primary Care Provider, Dr. Deirdre Priest, on 11/11/22.  At last visit, patient was seen for [facility] 3 CGM follow-up post initiation.  Today, patient arrives in good spirits and presents without any assistance.   Current diabetes medications include: Therapist, nutritional (insulin glargine) 10 units daily, metformin 2000 mg in the morning and 500 mg in the evening, Ozempic (semaglutide) 0.5 mg weekly - now tolerating. Current hypertension medications include: HCTZ 25 mg daily Current hyperlipidemia medications include: Atorvastatin 40 mg daily  Patient reports adherence to taking all medications as prescribed.   Do you feel that your medications are working for you? yes Have you been experiencing any side effects to the medications prescribed? no Do you have any problems obtaining medications due to transportation or finances? no Insurance coverage: Generic Commercial  Patient denies hypoglycemic events.  Patient denies nocturia (nighttime urination).  Patient denies neuropathy (nerve pain). Patient denies visual changes. Patient denies self foot exams.   Patient reported dietary habits: Eats larger lunch meals than dinner, sometimes consumes high-carbohydrate/high-sugar snacks such as ice cream.  Within the past 12 months, did you worry whether your food would run out before you got money to buy more? no Within the past 12 months, did the food you bought run out, and you didn't have money to get more? no  O:   Review of Systems  All other systems reviewed and are negative.   Physical Exam Constitutional:      Appearance: Normal appearance.  Cardiovascular:     Pulses: Normal pulses.  Neurological:     Mental Status: She is alert.   Psychiatric:        Mood and Affect: Mood normal.        Behavior: Behavior normal.        Thought Content: Thought content normal.    CGM Download:  % Time CGM is active: 93% Average Glucose: 185 mg/dL Glucose Management Indicator: 7.7%  Glucose Variability: 38.1% (goal <36%) Time in Goal:  - Time in range 70-180: 54% - Time above range: 44% - Time below range: 2% Observed patterns:   Lab Results  Component Value Date   HGBA1C 10.1 (A) 11/11/2022   Vitals:   02/04/23 1008  BP: 130/77  Pulse: 70  SpO2: 100%    Lipid Panel     Component Value Date/Time   CHOL 101 04/24/2021 0924   TRIG 67 04/24/2021 0924   HDL 47 04/24/2021 0924   CHOLHDL 2.1 04/24/2021 0924   CHOLHDL 2.8 12/11/2011 0926   VLDL 15 12/11/2011 0926   LDLCALC 39 04/24/2021 0924    Clinical Atherosclerotic Cardiovascular Disease (ASCVD): No  The ASCVD Risk score (Arnett DK, et al., 2019) failed to calculate for the following reasons:   The valid total cholesterol range is 130 to 320 mg/dL   A/P: Diabetes longstanding. Patient is able to verbalize appropriate hypoglycemia management plan. Medication adherence appears good. Control is improved but remains suboptimal with elevated average glucose of 185 mg/dL and glucose variability of 38.1%from CGM report.  -Decreased insulin glargine (Basaglar KwikPen) from 10 to 8 units every morning.  -Adjusted insulin lispro (Humalog) to 6 units with lunch meal and 4-6 units with dinner. -Continued semaglutide (Ozempic) 0.5  mg once weekly.   -Anticipate potential dose increase with semaglutide (Ozempic) to 1 mg once weekly with dose titration to decrease risk of nausea.  -Extensively discussed pathophysiology of diabetes, recommended lifestyle interventions, dietary effects on blood sugar control.  -Counseled on s/sx of and management of hypoglycemia.  -Next A1c anticipated in 4 weeks with next PCP visit with Dr. Deirdre Priest on 03/04/2023.   Written patient  instructions provided. Patient verbalized understanding of treatment plan.  Total time in face to face counseling 28 minutes.    Follow-up:  Pharmacist PRN. PCP on 03/04/2023 at 10:10 AM.  Patient seen with Haze Boyden PharmD Candidate and Bing Plume, PharmD Candidate.

## 2023-02-04 NOTE — Patient Instructions (Signed)
It was nice to see you today!  Your goal blood sugar is 80-130 before eating and less than 180 after eating.  Medication Changes:  Decrease insulin glargine (Basaglar Kwikpen) to 8 units once daily.   Adjust insulin Lispro (Humalog) to 6 units with Lunch and 4-6 units with Dinner.    Continue semaglutide (Ozempic) 0.5 mg once weekly.  Monitor blood sugars at home and keep a log (glucometer or piece of paper) to bring with you to your next visit.  Keep up the good work with diet and exercise. Aim for a diet full of vegetables, fruit and lean meats (chicken, [place], fish). Try to limit salt intake by eating fresh or frozen vegetables (instead of canned), rinse canned vegetables prior to cooking and do not add any additional salt to meals.

## 2023-02-05 NOTE — Progress Notes (Signed)
Reviewed and agree with Dr [name]'s plan.   

## 2023-03-04 ENCOUNTER — Ambulatory Visit: Payer: PRIVATE HEALTH INSURANCE | Admitting: Family Medicine

## 2023-03-10 NOTE — Progress Notes (Unsigned)
    SUBJECTIVE:   CHIEF COMPLAINT / HPI:   Diabetes Seeing Dr Raymondo Band regularly.  Started Ozempic and decreasing insulin.  Still having low blood sugar in the 50s but is asymptomatic.  Not much recent weight loss.  No specific exercise but is active     OBJECTIVE:   BP 118/81   Pulse 85   Ht 5\' 5"  (1.651 m)   Wt 190 lb 3.2 oz (86.3 kg)   SpO2 100%   BMI 31.65 kg/m   Good spirits  ASSESSMENT/PLAN:   Type 2 diabetes mellitus with hypercholesterolemia (HCC) Assessment & Plan: Improving but not at goal.  Will increase ozempic to 1mg  and continue to monitor her blood sugar.  Likely will continue to decrease her insulin with hopes of getting off completely.  Will follow up with Dr Raymondo Band.  A1c in 3 months   Orders: -     POCT glycosylated hemoglobin (Hb A1C) -     Semaglutide (1 MG/DOSE); Inject 1 mg into the skin once a week.  Dispense: 3 mL; Refill: 2     Patient Instructions  Good to see you today - Thank you for coming in  Things we discussed today:  You should exercise at least 20 minutes every day.    Choose something you like the most or hate the least.   Having a set time every day and having a partner will help you stick to it.    If you are too tired try to do at least 5 minutes, it often gets easier.   Increase the Ozempic  Hopefully will wean off the insulin   Please always bring your medication bottles  Come back to see me in 3 months    Carney Living, MD [facility]

## 2023-03-11 ENCOUNTER — Encounter: Payer: Self-pay | Admitting: Family Medicine

## 2023-03-11 ENCOUNTER — Other Ambulatory Visit: Payer: Self-pay

## 2023-03-11 ENCOUNTER — Ambulatory Visit (INDEPENDENT_AMBULATORY_CARE_PROVIDER_SITE_OTHER): Payer: Self-pay | Admitting: Family Medicine

## 2023-03-11 VITALS — BP 118/81 | HR 85 | Ht 65.0 in | Wt 190.2 lb

## 2023-03-11 DIAGNOSIS — E1169 Type 2 diabetes mellitus with other specified complication: Secondary | ICD-10-CM

## 2023-03-11 DIAGNOSIS — E7800 Pure hypercholesterolemia, unspecified: Secondary | ICD-10-CM

## 2023-03-11 LAB — POCT GLYCOSYLATED HEMOGLOBIN (HGB A1C): HbA1c, POC (controlled diabetic range): 8.2 % — AB (ref 0.0–7.0)

## 2023-03-11 MED ORDER — SEMAGLUTIDE (1 MG/DOSE) 4 MG/3ML ~~LOC~~ SOPN
1.0000 mg | PEN_INJECTOR | SUBCUTANEOUS | 2 refills | Status: DC
Start: 2023-03-11 — End: 2023-07-29

## 2023-03-11 NOTE — Assessment & Plan Note (Signed)
Improving but not at goal.  Will increase ozempic to 1mg  and continue to monitor her blood sugar.  Likely will continue to decrease her insulin with hopes of getting off completely.  Will follow up with Dr Raymondo Band.  A1c in 3 months

## 2023-03-11 NOTE — Patient Instructions (Signed)
Good to see you today - Thank you for coming in  Things we discussed today:  You should exercise at least 20 minutes every day.    Choose something you like the most or hate the least.   Having a set time every day and having a partner will help you stick to it.    If you are too tired try to do at least 5 minutes, it often gets easier.   Increase the Ozempic  Hopefully will wean off the insulin   Please always bring your medication bottles  Come back to see me in 3 months

## 2023-04-26 ENCOUNTER — Other Ambulatory Visit: Payer: Self-pay | Admitting: Family Medicine

## 2023-04-26 DIAGNOSIS — E119 Type 2 diabetes mellitus without complications: Secondary | ICD-10-CM

## 2023-05-11 ENCOUNTER — Other Ambulatory Visit: Payer: Self-pay | Admitting: Family Medicine

## 2023-05-11 DIAGNOSIS — I10 Essential (primary) hypertension: Secondary | ICD-10-CM

## 2023-06-13 ENCOUNTER — Other Ambulatory Visit: Payer: Self-pay | Admitting: Family Medicine

## 2023-06-13 DIAGNOSIS — E1169 Type 2 diabetes mellitus with other specified complication: Secondary | ICD-10-CM

## 2023-06-19 ENCOUNTER — Ambulatory Visit: Payer: PRIVATE HEALTH INSURANCE | Admitting: Pharmacist

## 2023-06-19 ENCOUNTER — Encounter: Payer: Self-pay | Admitting: Pharmacist

## 2023-06-19 VITALS — BP 131/69 | Ht 65.0 in | Wt 176.6 lb

## 2023-06-19 DIAGNOSIS — Z7985 Long-term (current) use of injectable non-insulin antidiabetic drugs: Secondary | ICD-10-CM | POA: Diagnosis not present

## 2023-06-19 DIAGNOSIS — E1169 Type 2 diabetes mellitus with other specified complication: Secondary | ICD-10-CM

## 2023-06-19 DIAGNOSIS — E7800 Pure hypercholesterolemia, unspecified: Secondary | ICD-10-CM

## 2023-06-19 DIAGNOSIS — Z23 Encounter for immunization: Secondary | ICD-10-CM

## 2023-06-19 MED ORDER — BASAGLAR KWIKPEN 100 UNIT/ML ~~LOC~~ SOPN
10.0000 [IU] | PEN_INJECTOR | Freq: Every day | SUBCUTANEOUS | Status: DC
Start: 2023-06-19 — End: 2023-08-05

## 2023-06-19 NOTE — Progress Notes (Signed)
S:     Chief Complaint  Patient presents with   Medication Management    Diabetes   62 y.o. female who presents for diabetes evaluation, education, and management. Patient arrives in good spirits and presents without any assistance. Patient is accompanied by grandson.   Patient was last seen by Primary Care Provider, Dr. Deirdre Priest, on 03/11/2023.   At last visit 01/2023, basaglar insulin glargine daily dose was lowered from 10 units daily to 8 units daily due to hypoglycemic trends.  At MD provider visit 02/2023, Ozempic (semaglutide) was increased from 0.5mg  to 1mg  weekly.   Current diabetes medications include: basaglar insulin glargine, humalog insulin lispro, metformin, semaglutide  Patient reports adherence to taking all medications as prescribed.   Do you feel that your medications are working for you? yes Have you been experiencing any side effects to the medications prescribed? no Do you have any problems obtaining medications due to transportation or finances? no Insurance coverage: Generic Commercial  Patient reports hypoglycemic events.  Patient denies nocturia (nighttime urination).  Patient denies neuropathy (nerve pain). Patient denies visual changes. Patient denies self foot exams.    Within the past 12 months, did you worry whether your food would run out before you got money to buy more? no Within the past 12 months, did the food you bought run out, and you didn't have money to get more? no   O:   Review of Systems  All other systems reviewed and are negative.   Physical Exam Vitals reviewed.  Constitutional:      Appearance: Normal appearance.  Pulmonary:     Effort: Pulmonary effort is normal.  Neurological:     Mental Status: She is alert.  Psychiatric:        Mood and Affect: Mood normal.        Behavior: Behavior normal.        Thought Content: Thought content normal.        Judgment: Judgment normal.    Libre3 CGM Download today on  06/19/2023 % Time CGM is active: 74 % Average Glucose: 216 mg/dL Glucose Management Indicator: 8.5%  Glucose Variability: 38.1 % (goal <36%) Time in Goal:  - Time in range 70-180: 40% - Time above range: 60% - Time below range: 0% Observed patterns: significant dips at night, patient advised to monitor for hypoglycemic trends.   Lab Results  Component Value Date   HGBA1C 8.2 (A) 03/11/2023   Vitals:   06/19/23 1033  BP: 131/69    Lipid Panel     Component Value Date/Time   CHOL 101 04/24/2021 0924   TRIG 67 04/24/2021 0924   HDL 47 04/24/2021 0924   CHOLHDL 2.1 04/24/2021 0924   CHOLHDL 2.8 12/11/2011 0926   VLDL 15 12/11/2011 0926   LDLCALC 39 04/24/2021 0924    Clinical Atherosclerotic Cardiovascular Disease (ASCVD): No  The ASCVD Risk score (Arnett DK, et al., 2019) failed to calculate for the following reasons:   The valid total cholesterol range is 130 to 320 mg/dL    A/P: Diabetes longstanding. Patient is able to verbalize appropriate hypoglycemia management plan. Medication adherence appears great. Control is suboptimal due to elevated average glucose of 216 mg/dL. -Increased dose of basal insulin Basaglar (insulin glargine) from 8 units to 10 units daily in the morning.  -Anticipate potential dose decrease with insulin lispro humalog from 6-6-4 units daily to 5-5-3 units daily. -Patient educated on purpose, proper use, and potential adverse effects of insulin glargine  and insulin lispro.  -Extensively discussed pathophysiology of diabetes, recommended lifestyle interventions, dietary effects on blood sugar control.  -Counseled on s/sx of and management of hypoglycemia.  -Next A1c anticipated to be taken in 6 weeks on 08/05/2023.    Hypertension longstanding. Blood pressure goal of <130/80 mmHg. Medication adherence appears great.    Written patient instructions provided. Patient verbalized understanding of treatment plan.  Total time in face to face  counseling 23 minutes.    Follow-up:  Pharmacist  PCP clinic visit in PRN Patient seen with Caprice Beaver, PharmD Candidate and Shona Simpson, PharmD Candidate.

## 2023-06-19 NOTE — Assessment & Plan Note (Signed)
Diabetes longstanding. Patient is able to verbalize appropriate hypoglycemia management plan. Medication adherence appears great. Control is suboptimal due to elevated average glucose of 216 mg/dL. -Increased dose of basal insulin Basaglar (insulin glargine) from 8 units to 10 units daily in the morning.  -Anticipate potential dose decrease with insulin lispro humalog from 6-6-4 units daily to 5-5-3 units daily. -Patient educated on purpose, proper use, and potential adverse effects of insulin glargine and insulin lispro.  -Extensively discussed pathophysiology of diabetes, recommended lifestyle interventions, dietary effects on blood sugar control.  -Counseled on s/sx of and management of hypoglycemia.

## 2023-06-19 NOTE — Patient Instructions (Signed)
It was nice to see you today!  Your goal blood sugar is 80-130 before eating and less than 180 after eating.  Medication Changes:  Increase basaglar insulin glargine from 8 units to 10 units daily  Continue all other medication the same.   Keep up the good work with diet and exercise. Aim for a diet full of vegetables, fruit and lean meats (chicken, [place], fish). Try to limit salt intake by eating fresh or frozen vegetables (instead of canned), rinse canned vegetables prior to cooking and do not add any additional salt to meals.   Call if you have any low blood sugars (double digits) or concerns.

## 2023-06-23 NOTE — Progress Notes (Signed)
Reviewed and agree with Dr [name]'s plan.   

## 2023-07-10 ENCOUNTER — Other Ambulatory Visit: Payer: Self-pay | Admitting: Family Medicine

## 2023-07-10 DIAGNOSIS — I10 Essential (primary) hypertension: Secondary | ICD-10-CM

## 2023-07-29 ENCOUNTER — Other Ambulatory Visit: Payer: Self-pay | Admitting: Family Medicine

## 2023-07-29 DIAGNOSIS — E7800 Pure hypercholesterolemia, unspecified: Secondary | ICD-10-CM

## 2023-07-29 DIAGNOSIS — E1169 Type 2 diabetes mellitus with other specified complication: Secondary | ICD-10-CM

## 2023-08-05 ENCOUNTER — Encounter: Payer: Self-pay | Admitting: Pharmacist

## 2023-08-05 ENCOUNTER — Ambulatory Visit (INDEPENDENT_AMBULATORY_CARE_PROVIDER_SITE_OTHER): Payer: 59 | Admitting: Pharmacist

## 2023-08-05 VITALS — BP 116/79 | HR 63 | Wt 174.6 lb

## 2023-08-05 DIAGNOSIS — E7800 Pure hypercholesterolemia, unspecified: Secondary | ICD-10-CM

## 2023-08-05 DIAGNOSIS — E1169 Type 2 diabetes mellitus with other specified complication: Secondary | ICD-10-CM

## 2023-08-05 MED ORDER — INSULIN LISPRO (1 UNIT DIAL) 100 UNIT/ML (KWIKPEN): 4.0000 [IU] | PEN_INJECTOR | Freq: Three times a day (TID) | SUBCUTANEOUS | 11 refills | Status: DC

## 2023-08-05 MED ORDER — BASAGLAR KWIKPEN 100 UNIT/ML ~~LOC~~ SOPN
11.0000 [IU] | PEN_INJECTOR | Freq: Every day | SUBCUTANEOUS | Status: AC
Start: 2023-08-05 — End: ?

## 2023-08-05 NOTE — Assessment & Plan Note (Signed)
Diabetes longstanding currently uncontrolled. Patient is able to verbalize appropriate hypoglycemia management plan. Medication adherence appears good. Continued steady weight loss with semaglutide (Ozempic) dose and tolerating dose well. Control is suboptimal due to average glucose of 211 mg/dL, no reports of hypoglycemia since returning from [place], warranting dose increase .  -Increased insulin Basaglar (glargine) from 10 units once daily to 11 units once daily, discussed reducing back to 10 units if she experiences hypoglycemic events -Continued insulin Humalog 4-6 units with meals (6 with breakfast/lunch, 4 units with dinner), metformin 1000 mg 2 tablets in the AM and 0.5 tablets PM, semaglutide (Ozempic) 1 mg once weekly -Patient educated on purpose, proper use, and potential adverse effects.  -Extensively discussed pathophysiology of diabetes, recommended lifestyle interventions, dietary effects on blood sugar control.  -Counseled on s/sx of and management of hypoglycemia.

## 2023-08-05 NOTE — Progress Notes (Signed)
S:     Chief Complaint  Patient presents with   Medication Management    T2DM   62 y.o. female who presents for diabetes evaluation, education, and management. Patient arrives in good spirits and presents without any assistance. Reports her recent trip to [place] was great.   Patient was last seen by Primary Care Provider, Dr. Deirdre Priest, on 03/11/2023.   PMH is significant for T2DM, HTN.  At last pharmacy visit on 10/03, increased insulin Basaglar (glargine) from 8 units to 10 units once daily.   Patient reports Diabetes was diagnosed in ~10-15 years.   Family/Social History: Father and sister have T2DM  Current diabetes medications include: insulin Basaglar (glargine) 10 units once daily, insulin Humalog 4-6 units with meals (6 with breakfast/lunch, 4 units with dinner), metformin 1000 mg 2 tablets in the AM and 0.5 tablets PM, semaglutide (Ozempic) 1 mg once weekly Current hypertension medications include: hydrochlorothiazide 25 mg once daily,  Current hyperlipidemia medications include: atorvastatin 40 mg once daily  Patient reports adherence to taking all medications as prescribed.   Do you feel that your medications are working for you? yes Have you been experiencing any side effects to the medications prescribed? no Do you have any problems obtaining medications due to transportation or finances? no Insurance coverage: Jaye Beagle  Patient reports hypoglycemic events. Particularly when she was in [place], but she was very active at that time.   Patient denies nocturia (nighttime urination).  Patient denies neuropathy (nerve pain). Patient denies visual changes. Patient reports self foot exams.   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   Physical Exam Vitals reviewed.  Constitutional:      Appearance: Normal appearance.  Pulmonary:     Effort: Pulmonary effort is normal.  Neurological:     Mental Status: She is alert.  Psychiatric:        Mood and Affect: Mood normal.        Behavior: Behavior normal.        Thought Content: Thought content normal.        Judgment: Judgment normal.    7 day average blood glucose: 211  Libre3 CGM Download today from 11/6-11/19 % Time CGM is active: 28% Average Glucose: 211 mg/dL Glucose Management Indicator: -  Glucose Variability: 29.5% (goal <36%) Time in Goal:  - Time in range 70-180: 34% - Time above range: 66% - Time below range: 0% Observed patterns:   Lab Results  Component Value Date   HGBA1C 8.2 (A) 03/11/2023   Vitals:   08/05/23 1046  BP: 116/79  Pulse: 63  SpO2: 100%    Lipid Panel     Component Value Date/Time   CHOL 101 04/24/2021 0924   TRIG 67 04/24/2021 0924   HDL 47 04/24/2021 0924   CHOLHDL 2.1 04/24/2021 0924   CHOLHDL 2.8 12/11/2011 0926   VLDL 15 12/11/2011 0926   LDLCALC 39 04/24/2021 0924    Clinical Atherosclerotic Cardiovascular Disease (ASCVD): No  The ASCVD Risk score (Arnett DK, et al., 2019) failed to calculate for the following reasons:   The valid total cholesterol range is 130 to 320 mg/dL   A/P: Diabetes longstanding currently uncontrolled. Patient is able to verbalize  appropriate hypoglycemia management plan. Medication adherence appears good. Continued steady weight loss with semaglutide (Ozempic) dose and tolerating dose well. Control is suboptimal due to average glucose of 211 mg/dL, no reports of hypoglycemia since returning from [place], warranting dose increase .  -Increased insulin Basaglar (glargine) from 10 units once daily to 11 units once daily, discussed reducing back to 10 units if she experiences hypoglycemic events -Continued  insulin Humalog 4-6 units with meals (6 with breakfast/lunch, 4 units with dinner), metformin 1000 mg 2 tablets in the AM and 0.5 tablets PM, semaglutide (Ozempic) 1 mg once weekly -Patient educated on purpose, proper use, and potential adverse effects.  -Extensively discussed pathophysiology of diabetes, recommended lifestyle interventions, dietary effects on blood sugar control.  -Counseled on s/sx of and management of hypoglycemia.  -Next A1c anticipated 12/17 w/ Dr. Deirdre Priest.   ASCVD risk - primary prevention in patient with diabetes. Last LDL is 39 at goal of <70 mg/dL.  -Continued atorvastatin 40 mg  Hypertension longstanding currently controlled. Blood pressure goal of <130/80 mmHg. Medication adherence good.  -Continued hydrochlorothiazide 25 mg once daily  Written patient instructions provided. Patient verbalized understanding of treatment plan.  Total time in face to face counseling 27 minutes.    Follow-up:  Pharmacist ~Jan/Feb 2025 - per PCP request at upcoming visit.  PCP clinic visit in 12/17 w/ Dr. Deirdre Priest Patient seen with Shona Simpson, PharmD Candidate.

## 2023-08-05 NOTE — Patient Instructions (Addendum)
It was nice to see you today!  Your goal blood sugar is 80-130 before eating and less than 180 after eating.  Medication Changes: Increase insulin Basaglar (glargine) from 10 units once daily to 11 units once daily CONTINUE insulin Humalog (lispro) at 6-6-4 units with breakfast, lunch, dinner   Continue all other medication the same. po  Keep up the good work with diet and exercise. Aim for a diet full of vegetables, fruit and lean meats (chicken, [place], fish). Try to limit salt intake by eating fresh or frozen vegetables (instead of canned), rinse canned vegetables prior to cooking and do not add any additional salt to meals.

## 2023-08-05 NOTE — Progress Notes (Signed)
Reviewed and agree with Dr [name]'s plan.   

## 2023-09-02 ENCOUNTER — Ambulatory Visit: Payer: 59 | Admitting: Family Medicine

## 2023-09-02 VITALS — BP 134/72 | HR 70 | Ht 66.0 in | Wt 172.6 lb

## 2023-09-02 DIAGNOSIS — E1169 Type 2 diabetes mellitus with other specified complication: Secondary | ICD-10-CM

## 2023-09-02 DIAGNOSIS — E785 Hyperlipidemia, unspecified: Secondary | ICD-10-CM | POA: Diagnosis not present

## 2023-09-02 DIAGNOSIS — I10 Essential (primary) hypertension: Secondary | ICD-10-CM

## 2023-09-02 DIAGNOSIS — E7800 Pure hypercholesterolemia, unspecified: Secondary | ICD-10-CM

## 2023-09-02 DIAGNOSIS — Z7985 Long-term (current) use of injectable non-insulin antidiabetic drugs: Secondary | ICD-10-CM

## 2023-09-02 LAB — POCT GLYCOSYLATED HEMOGLOBIN (HGB A1C): HbA1c, POC (controlled diabetic range): 8.3 % — AB (ref 0.0–7.0)

## 2023-09-02 NOTE — Assessment & Plan Note (Signed)
Check lipids today 

## 2023-09-02 NOTE — Assessment & Plan Note (Signed)
At goal continue current medications  

## 2023-09-02 NOTE — Patient Instructions (Addendum)
Things we discussed today:  I will call you if your tests are not good.  Otherwise, I will send you a message on MyChart (if it is active) or a letter in the mail..  If you do not hear from me with in 2 weeks please call our office.     Follow up with Dr Raymondo Band.  Lower your short acting insulin to prevent low blood sugar  Consider adding an SGLT2 - jardiance   Line Dancing sound fun and good exercise  I have enjoyed working with you - Be Well

## 2023-09-02 NOTE — Progress Notes (Signed)
    SUBJECTIVE:   CHIEF COMPLAINT / HPI:   Diabetes Her blood sugar are usually good with following regimen Dr Raymondo Band note in November "Increased insulin Basaglar (glargine) from 10 units once daily to 11 units once daily, discussed reducing back to 10 units if she experiences hypoglycemic events -Continued insulin Humalog 4-6 units with meals (6 with breakfast/lunch, 4 units with dinner), metformin 1000 mg 2 tablets in the AM and 0.5 tablets PM, semaglutide (Ozempic) 1 mg once weekly" However has had some low blood sugar after 6 units with some meals.  Hypertension Taking hydrochlorothiazide every day  Lipids Taking lipitor daily  Neck pain Focal area over mid trachea is mildly tender for several weeks.  No trauma or other symptoms of sore throat or problems swallowing or jitteriness or voice changes  Feels is slowly improving  PERTINENT  PMH / PSH: plans on starting a line dancing class in January   OBJECTIVE:   BP 134/72   Pulse 70   Ht 5\' 6"  (1.676 m)   Wt 172 lb 9.6 oz (78.3 kg)   SpO2 100%   BMI 27.86 kg/m   Heart - Regular rate and rhythm.  No murmurs, gallops or rubs.    Lungs:  Normal respiratory effort, chest expands symmetrically. Lungs are clear to auscultation, no crackles or wheezes. Extremities:  No cyanosis, edema, or deformity noted with good range of motion of all major joints.   Neck:  No deformities, thyromegaly, masses, mild tenderness over larynx without mass.   Supple with full range of motion without pain. Throat: normal mucosa, no exudate, uvula midline, no redness    ASSESSMENT/PLAN:   Type 2 diabetes mellitus with hypercholesterolemia (HCC) Assessment & Plan: Improved but not quite and goal.  She will decrease slightly her mealtime coverage due to episodic low blood sugar.  Discussed retrying SGLT2 or increasing ozempic but will defer to Dr Raymondo Band who will follow her.  She is not currently exercising but plans to start in January   Orders: -      POCT glycosylated hemoglobin (Hb A1C)  Hyperlipidemia associated with type 2 diabetes mellitus (HCC) Assessment & Plan: Check lipids today  Orders: -     Lipid panel  Essential hypertension, benign Assessment & Plan: At goal continue current medications     Neck Pain Consistent with mild irritation of external larynx.  No signs of thyromegaly or adenopathy or infection.  Does have former smoking history.  Recommend monitor and return if does not slowly resolve   Patient Instructions  Things we discussed today:  I will call you if your tests are not good.  Otherwise, I will send you a message on MyChart (if it is active) or a letter in the mail..  If you do not hear from me with in 2 weeks please call our office.     Follow up with Dr Raymondo Band.  Lower your short acting insulin to prevent low blood sugar  Consider adding an SGLT2 - jardiance   Line Dancing sound fun and good exercise  I have enjoyed working with you - Be Well    Theresa Living, MD [facility]

## 2023-09-02 NOTE — Assessment & Plan Note (Addendum)
" >>  ASSESSMENT AND PLAN FOR TYPE 2 DIABETES MELLITUS WITH HYPERCHOLESTEROLEMIA (HCC) WRITTEN ON 09/02/2023 10:36 AM BY Farheen Pfahler L, MD  Improved but not quite and goal.  She will decrease slightly her mealtime coverage due to episodic low blood sugar.  Discussed retrying SGLT2 or increasing ozempic  but will defer to Dr Amalia who will follow her.  She is not currently exercising but plans to start in January    >>ASSESSMENT AND PLAN FOR HYPERLIPIDEMIA ASSOCIATED WITH TYPE 2 DIABETES MELLITUS (HCC) WRITTEN ON 09/02/2023 10:37 AM BY Caelen Higinbotham L, MD  Check lipids today "

## 2023-09-03 LAB — LIPID PANEL
Chol/HDL Ratio: 2.1 {ratio} (ref 0.0–4.4)
Cholesterol, Total: 109 mg/dL (ref 100–199)
HDL: 52 mg/dL (ref >39)
LDL Chol Calc (NIH): 42 mg/dL (ref 0–99)
Triglycerides: 72 mg/dL (ref 0–149)
VLDL Cholesterol Cal: 15 mg/dL (ref 5–40)

## 2023-09-14 ENCOUNTER — Other Ambulatory Visit: Payer: Self-pay | Admitting: Family Medicine

## 2023-09-14 DIAGNOSIS — E1169 Type 2 diabetes mellitus with other specified complication: Secondary | ICD-10-CM

## 2023-09-14 DIAGNOSIS — I10 Essential (primary) hypertension: Secondary | ICD-10-CM

## 2023-10-03 ENCOUNTER — Other Ambulatory Visit: Payer: Self-pay | Admitting: Family Medicine

## 2023-10-03 DIAGNOSIS — E119 Type 2 diabetes mellitus without complications: Secondary | ICD-10-CM

## 2023-10-17 ENCOUNTER — Other Ambulatory Visit: Payer: Self-pay | Admitting: Family Medicine

## 2023-10-17 DIAGNOSIS — E7800 Pure hypercholesterolemia, unspecified: Secondary | ICD-10-CM

## 2023-11-11 DIAGNOSIS — E11319 Type 2 diabetes mellitus with unspecified diabetic retinopathy without macular edema: Secondary | ICD-10-CM | POA: Insufficient documentation

## 2023-11-12 LAB — HM DIABETES EYE EXAM

## 2023-11-14 LAB — HM DIABETES EYE EXAM

## 2023-11-17 NOTE — Progress Notes (Signed)
 Marland Kitchen

## 2023-12-05 ENCOUNTER — Telehealth: Payer: Self-pay

## 2023-12-05 NOTE — Telephone Encounter (Signed)
 Pharmacy Patient Advocate Encounter   Received notification from CoverMyMeds that prior authorization for [facility] 1MG  DOSE PENS is required/requested.   Insurance verification completed.   The patient is insured through [facility] .   PA required; PA submitted to above mentioned insurance via CoverMyMeds Key/confirmation #/EOC [id number]. Status is pending

## 2023-12-11 NOTE — Telephone Encounter (Signed)
 Pharmacy Patient Advocate Encounter  Received notification from  [facility]  that Prior Authorization for [facility] has been APPROVED from 12/09/23 to 12/08/24

## 2023-12-23 ENCOUNTER — Other Ambulatory Visit: Payer: Self-pay | Admitting: Family Medicine

## 2023-12-23 DIAGNOSIS — E1169 Type 2 diabetes mellitus with other specified complication: Secondary | ICD-10-CM

## 2024-02-11 ENCOUNTER — Other Ambulatory Visit: Payer: Self-pay | Admitting: Student

## 2024-02-11 DIAGNOSIS — E1169 Type 2 diabetes mellitus with other specified complication: Secondary | ICD-10-CM

## 2024-02-16 ENCOUNTER — Other Ambulatory Visit: Payer: Self-pay

## 2024-02-16 MED ORDER — INSULIN LISPRO (1 UNIT DIAL) 100 UNIT/ML (KWIKPEN): 4.0000 [IU] | PEN_INJECTOR | Freq: Three times a day (TID) | SUBCUTANEOUS | 0 refills | Status: DC

## 2024-02-24 ENCOUNTER — Encounter: Payer: Self-pay | Admitting: *Deleted

## 2024-02-25 ENCOUNTER — Encounter: Payer: Self-pay | Admitting: Student

## 2024-03-01 ENCOUNTER — Telehealth: Payer: Self-pay | Admitting: Pharmacist

## 2024-03-01 DIAGNOSIS — E1169 Type 2 diabetes mellitus with other specified complication: Secondary | ICD-10-CM

## 2024-03-01 MED ORDER — BASAGLAR KWIKPEN 100 UNIT/ML ~~LOC~~ SOPN: 9.0000 [IU] | PEN_INJECTOR | Freq: Every day | SUBCUTANEOUS | Status: DC

## 2024-03-01 MED ORDER — FREESTYLE LIBRE 3 PLUS SENSOR MISC: 11 refills | Status: AC

## 2024-03-01 NOTE — Assessment & Plan Note (Signed)
 Since last contact patient reports she has been doing better overall.    CGM review revealed:  GMI - 7.3 Average 167 Variability 42%  Current Medications include: Ozempic  (semaglutide ) 1mg  weekly, Lantus  (insulin  glargine) 10 units once daily, Humalog  (insulin  lispro) 4-6 units with meals 3 x daily.  Patient denies any significant medication related side effects. However, admits to low readings with 6% of time below average.   Medication Plan: - Reduce Lantus  (insulin  glargine) from 10 to 9 units once daily in attempt to minimize lows overnight.  We also discussed using lower dose of 4 units for Humalog  (insulin  lispro) with evening meal.

## 2024-03-01 NOTE — Telephone Encounter (Signed)
 Patient contacted for follow-up of glucose control and request for update to [facility] from [place] 3 to Libre3+  Since last contact patient reports she has been doing better overall.    CGM review revealed:  GMI - 7.3 Average 167 Variability 42%  Current Medications include: Ozempic  (semaglutide ) 1mg  weekly, Lantus  (insulin  glargine) 10 units once daily, Humalog  (insulin  lispro) 4-6 units with meals 3 x daily.  Patient denies any significant medication related side effects. However, admits to low readings with 6% of time below average.   Medication Plan: - Reduce Lantus  (insulin  glargine) from 10 to 9 units once daily in attempt to minimize lows overnight.  We also discussed using lower dose of 4 units for Humalog  (insulin  lispro) with evening meal.   New prescription for Libre3+ sensors sent to her preferred pharmacy.   Total time with patient call and documentation of interaction: 14 minutes.

## 2024-03-02 NOTE — Telephone Encounter (Signed)
 Reviewed and agree with Dr Macky Lower plan.

## 2024-03-08 ENCOUNTER — Other Ambulatory Visit: Payer: Self-pay

## 2024-03-09 MED ORDER — INSULIN LISPRO (1 UNIT DIAL) 100 UNIT/ML (KWIKPEN): 4.0000 [IU] | PEN_INJECTOR | Freq: Three times a day (TID) | SUBCUTANEOUS | 0 refills | Status: DC

## 2024-04-01 ENCOUNTER — Other Ambulatory Visit: Payer: Self-pay

## 2024-04-01 DIAGNOSIS — E119 Type 2 diabetes mellitus without complications: Secondary | ICD-10-CM

## 2024-04-02 MED ORDER — METFORMIN HCL 1000 MG PO TABS: ORAL_TABLET | ORAL | 1 refills | Status: DC

## 2024-04-02 NOTE — Telephone Encounter (Signed)
 Spoke with patient. Made follow up appt for Aug 1st at 9:45a. Nelson Land, CMA

## 2024-04-12 ENCOUNTER — Other Ambulatory Visit: Payer: Self-pay

## 2024-04-12 MED ORDER — INSULIN LISPRO (1 UNIT DIAL) 100 UNIT/ML (KWIKPEN): 4.0000 [IU] | PEN_INJECTOR | Freq: Three times a day (TID) | SUBCUTANEOUS | 0 refills | Status: DC

## 2024-04-12 NOTE — Telephone Encounter (Signed)
 Patient is scheduled for 08/1 @945  with you

## 2024-04-13 ENCOUNTER — Encounter: Payer: Self-pay | Admitting: Family Medicine

## 2024-04-14 MED ORDER — INSULIN LISPRO (1 UNIT DIAL) 100 UNIT/ML (KWIKPEN): 4.0000 [IU] | PEN_INJECTOR | Freq: Three times a day (TID) | SUBCUTANEOUS | 0 refills | Status: DC

## 2024-04-14 NOTE — Telephone Encounter (Signed)
 Called [facility] and cancelled Humalog  prescription.   [facility] [address].

## 2024-04-16 ENCOUNTER — Ambulatory Visit: Admitting: Family Medicine

## 2024-04-28 ENCOUNTER — Ambulatory Visit: Admitting: Family Medicine

## 2024-04-28 DIAGNOSIS — E1169 Type 2 diabetes mellitus with other specified complication: Secondary | ICD-10-CM

## 2024-05-12 ENCOUNTER — Other Ambulatory Visit (HOSPITAL_BASED_OUTPATIENT_CLINIC_OR_DEPARTMENT_OTHER): Payer: Self-pay

## 2024-05-14 ENCOUNTER — Other Ambulatory Visit: Payer: Self-pay | Admitting: Family Medicine

## 2024-05-18 ENCOUNTER — Other Ambulatory Visit: Payer: Self-pay

## 2024-05-18 DIAGNOSIS — E119 Type 2 diabetes mellitus without complications: Secondary | ICD-10-CM

## 2024-05-18 DIAGNOSIS — E1169 Type 2 diabetes mellitus with other specified complication: Secondary | ICD-10-CM

## 2024-05-18 MED ORDER — OZEMPIC (1 MG/DOSE) 4 MG/3ML ~~LOC~~ SOPN
1.0000 mg | PEN_INJECTOR | SUBCUTANEOUS | 0 refills | Status: DC
Start: 2024-05-18 — End: 2024-06-21

## 2024-05-19 MED ORDER — METFORMIN HCL 1000 MG PO TABS: ORAL_TABLET | ORAL | 0 refills | Status: DC

## 2024-06-20 ENCOUNTER — Other Ambulatory Visit: Payer: Self-pay | Admitting: Family Medicine

## 2024-06-20 DIAGNOSIS — E1169 Type 2 diabetes mellitus with other specified complication: Secondary | ICD-10-CM

## 2024-06-21 ENCOUNTER — Other Ambulatory Visit: Payer: Self-pay | Admitting: Family Medicine

## 2024-06-21 DIAGNOSIS — E119 Type 2 diabetes mellitus without complications: Secondary | ICD-10-CM

## 2024-07-22 ENCOUNTER — Other Ambulatory Visit: Payer: Self-pay | Admitting: Family Medicine

## 2024-07-22 DIAGNOSIS — E1169 Type 2 diabetes mellitus with other specified complication: Secondary | ICD-10-CM

## 2024-08-31 ENCOUNTER — Other Ambulatory Visit: Payer: Self-pay | Admitting: Family Medicine

## 2024-08-31 DIAGNOSIS — E1169 Type 2 diabetes mellitus with other specified complication: Secondary | ICD-10-CM

## 2024-09-13 ENCOUNTER — Encounter: Payer: Self-pay | Admitting: Family Medicine

## 2024-09-20 ENCOUNTER — Other Ambulatory Visit: Payer: Self-pay | Admitting: Family Medicine

## 2024-09-23 ENCOUNTER — Encounter: Payer: Self-pay | Admitting: Family Medicine

## 2024-09-23 ENCOUNTER — Ambulatory Visit: Payer: Self-pay | Admitting: Family Medicine

## 2024-09-23 VITALS — BP 131/90 | HR 82 | Ht 66.0 in | Wt 172.0 lb

## 2024-09-23 DIAGNOSIS — E7800 Pure hypercholesterolemia, unspecified: Secondary | ICD-10-CM

## 2024-09-23 DIAGNOSIS — E11319 Type 2 diabetes mellitus with unspecified diabetic retinopathy without macular edema: Secondary | ICD-10-CM

## 2024-09-23 DIAGNOSIS — Z1231 Encounter for screening mammogram for malignant neoplasm of breast: Secondary | ICD-10-CM | POA: Diagnosis not present

## 2024-09-23 DIAGNOSIS — E1169 Type 2 diabetes mellitus with other specified complication: Secondary | ICD-10-CM | POA: Diagnosis not present

## 2024-09-23 DIAGNOSIS — Z23 Encounter for immunization: Secondary | ICD-10-CM

## 2024-09-23 LAB — POCT GLYCOSYLATED HEMOGLOBIN (HGB A1C): HbA1c, POC (controlled diabetic range): 8.2 % — AB (ref 0.0–7.0)

## 2024-09-23 NOTE — Assessment & Plan Note (Addendum)
 Mild per ophthalmologist, will continue to recommend yearly eye exams.

## 2024-09-23 NOTE — Progress Notes (Signed)
 "   SUBJECTIVE:   Chief compliant/HPI: annual examination  Theresa Miles is a 64 y.o. who presents today for an annual exam.   History tabs reviewed and updated.   Review of systems form reviewed and notable for:   Type 2 diabetes --DM regimen: CGM, glargine 10U daily, lispro 4-6u TIDM (6-7 units with breakfast and lunch, 4-5 units with dinner), metformin  1000mg  AM/500mg  PM, ozempic  1mg  weekly (weight stable today compared to visit in late 2024) --CGM readings: usually <170 in AM, mealtime 110 after taking SA --hypoglycemic events: bottoms out to low 50s at night, asymptomatic but wakes up when sensor alarms.  Per CGM review, low glucose range occur 4% of the time in 30 days --statin: lipitor 40mg , last lipid panel 2024, TC 109, LDL 42 --retinopathy found at last eye exam Feb 2025, ophthalmologist told her it was mild and no intervention was needed --needs foot exam, has not seen a podiatrist before --has seen [name] in the past, would like to see him again -- Interested in starting SGLT2, Dr Jeanelle mentioned this to her in the past  HTN --hydrochlorothiazide  25mg  daily -- Reports adherence -Does not check blood pressure at home  Healthcare maintenance --due for flu, covid, pneumococcal, shingles, tdap -- Would like flu and pneumococcal vaccines today  Quit smoking in 2005, smoked about 5 cigarettes daily for about 20 years  OBJECTIVE:   BP (!) 131/90   Pulse 82   Ht 5' 6 (1.676 m)   Wt 172 lb (78 kg)   SpO2 100%   BMI 27.76 kg/m   General: Awake and conversant, no acute distress CV: RRR, normal S1/S2, no M/R/G Pulm: CTAB, normal work of breathing on room air, no W/R/R. Abdominal: Normoactive bowel sounds, soft, nontender, nondistended Neuro: No focal deficits Psych: Appropriate mood and affect Extremities: No edema to BLE  ASSESSMENT/PLAN:   Assessment & Plan Type 2 diabetes mellitus with hypercholesterolemia (HCC) Doing well, A1c 8.2 today and good  adherence with medications.  Has been taking 10 units of long-acting insulin  daily, will decrease to 9 units given somewhat frequent low readings at night.  Patient to follow-up with [facility] pharmacist to discuss medication regimen and for consideration of SGLT2.  Patient unable to provide urine sample for [facility] today, advised having this lab done when she returns to see [facility] pharmacist.  Ordered future MCR. - Basic metabolic panel with GFR - POCT glycosylated hemoglobin (Hb A1C) - Referral for podiatry placed Diabetic retinopathy associated with type 2 diabetes mellitus, macular edema presence unspecified, unspecified laterality, unspecified retinopathy severity (HCC) Mild per ophthalmologist, will continue to recommend yearly eye exams.   Annual Examination  See AVS for age appropriate recommendations  PHQ score not completed, patient denies concerns with her mood today BP reviewed and at goal.  Asked about intimate partner violence and resources given as appropriate  Advance directives discussion not completed  Considered the following items based upon USPSTF recommendations: Diabetes screening: n/a, has DM2 HIV testing:discussed and declined Hepatitis C: discussed and declined Hepatitis B:discussed and declined Syphilis if at high risk: discussed and declined GC/CT not at high risk and not ordered. Lipid panel (nonfasting or fasting) discussed based upon AHA recommendations and ordered.  Consider repeat every 4-6 years.  Reviewed risk factors for latent tuberculosis and not indicated Osteoporosis screening considered based upon risk of fracture-denies family history of large bone fracture at young age. DEXA not ordered.   Cancer Screening Discussion  Cervical cancer screening: not indicated given history of  hysterectomy with prior normal cytology.  Breast cancer screening: last mammogram 2023, benign. recently completed and repeat not yet indicated Discussed family history, BRCA testing  not indicated.  Lung cancer screening: smoked 5 cigarettes per day for 20 years, quit in early 200snot indicated as does not meet criteria.  See documentation below regarding indications/risks/benefits.  Colorectal cancer screening: up to date on screening for CRC..  Vaccinations flu and pneumococcal completed today.   Follow up in 1 year or sooner if indicated.  MyChart Activation: Already signed up  Rea Raring, MD [facility]  "

## 2024-09-23 NOTE — Assessment & Plan Note (Addendum)
 Doing well, A1c 8.2 today and good adherence with medications.  Has been taking 10 units of long-acting insulin  daily, will decrease to 9 units given somewhat frequent low readings at night.  Patient to follow-up with [facility] pharmacist to discuss medication regimen and for consideration of SGLT2.  Patient unable to provide urine sample for [facility] today, advised having this lab done when she returns to see [facility] pharmacist.  Ordered future MCR. - Basic metabolic panel with GFR - POCT glycosylated hemoglobin (Hb A1C) - Referral for podiatry placed

## 2024-09-23 NOTE — Patient Instructions (Addendum)
 Thank you for coming in today! Here is a summary of what we discussed:  -Please decrease long acting insulin  to 9 units daily. Please schedule a follow up with Dr [name] to discuss this and possibly starting an SGLT-2 medicine  -You can get the Shingrix vaccine at the pharmacy. You will need a 2nd shot 2-6 months after the first. This vaccine is important to help prevent shingles.    -You need a mammogram to prevent breast cancer.  Please schedule an appointment.  You can call [phone].      -You should hear from the podiatrist about a foot exam  We are checking some labs today. If they are abnormal, I will call you. If they are normal, I will send you a MyChart message (if it is active) or a letter in the mail. If you do not hear about your labs in the next 2 weeks, please call the office.   Please call the clinic at [phone] if your symptoms worsen or you have any concerns.  Best, Dr Adele

## 2024-09-24 ENCOUNTER — Ambulatory Visit: Payer: Self-pay | Admitting: Family Medicine

## 2024-09-24 LAB — LIPID PANEL
Chol/HDL Ratio: 1.8 ratio (ref 0.0–4.4)
Cholesterol, Total: 117 mg/dL (ref 100–199)
HDL: 66 mg/dL
LDL Chol Calc (NIH): 40 mg/dL (ref 0–99)
Triglycerides: 43 mg/dL (ref 0–149)
VLDL Cholesterol Cal: 11 mg/dL (ref 5–40)

## 2024-09-24 LAB — BASIC METABOLIC PANEL WITH GFR
BUN/Creatinine Ratio: 17 (ref 12–28)
BUN: 17 mg/dL (ref 8–27)
CO2: 24 mmol/L (ref 20–29)
Calcium: 8.9 mg/dL (ref 8.7–10.3)
Chloride: 105 mmol/L (ref 96–106)
Creatinine, Ser: 0.98 mg/dL (ref 0.57–1.00)
Glucose: 149 mg/dL — ABNORMAL HIGH (ref 70–99)
Potassium: 4.3 mmol/L (ref 3.5–5.2)
Sodium: 142 mmol/L (ref 134–144)
eGFR: 65 mL/min/1.73

## 2024-09-26 ENCOUNTER — Other Ambulatory Visit: Payer: Self-pay | Admitting: Family Medicine

## 2024-09-26 DIAGNOSIS — E119 Type 2 diabetes mellitus without complications: Secondary | ICD-10-CM

## 2024-09-27 ENCOUNTER — Other Ambulatory Visit: Payer: Self-pay | Admitting: Family Medicine

## 2024-09-27 DIAGNOSIS — E1169 Type 2 diabetes mellitus with other specified complication: Secondary | ICD-10-CM

## 2024-10-05 ENCOUNTER — Other Ambulatory Visit: Payer: Self-pay | Admitting: Family Medicine

## 2024-10-05 DIAGNOSIS — E1169 Type 2 diabetes mellitus with other specified complication: Secondary | ICD-10-CM

## 2024-10-07 ENCOUNTER — Other Ambulatory Visit: Payer: Self-pay | Admitting: Family Medicine

## 2024-10-07 DIAGNOSIS — E1169 Type 2 diabetes mellitus with other specified complication: Secondary | ICD-10-CM

## 2024-10-17 ENCOUNTER — Other Ambulatory Visit: Payer: Self-pay | Admitting: Family Medicine

## 2024-10-17 DIAGNOSIS — I10 Essential (primary) hypertension: Secondary | ICD-10-CM
